# Patient Record
Sex: Female | Born: 1935
Health system: Southern US, Community
[De-identification: ages and names within clinical notes are randomized; demographics above are authoritative.]

## PROBLEM LIST (undated history)

## (undated) DIAGNOSIS — R296 Repeated falls: Secondary | ICD-10-CM

## (undated) DIAGNOSIS — S3282XA Multiple fractures of pelvis without disruption of pelvic ring, initial encounter for closed fracture: Secondary | ICD-10-CM

## (undated) DIAGNOSIS — F329 Major depressive disorder, single episode, unspecified: Secondary | ICD-10-CM

## (undated) DIAGNOSIS — J189 Pneumonia, unspecified organism: Secondary | ICD-10-CM

## (undated) DIAGNOSIS — N390 Urinary tract infection, site not specified: Secondary | ICD-10-CM

## (undated) DIAGNOSIS — IMO0002 Reserved for concepts with insufficient information to code with codable children: Secondary | ICD-10-CM

## (undated) DIAGNOSIS — F0390 Unspecified dementia without behavioral disturbance: Secondary | ICD-10-CM

## (undated) DIAGNOSIS — F32A Depression, unspecified: Secondary | ICD-10-CM

## (undated) HISTORY — PX: ABDOMINAL HYSTERECTOMY: SHX81

## (undated) HISTORY — PX: FRACTURE SURGERY: SHX138

---

## 2004-09-02 ENCOUNTER — Ambulatory Visit: Payer: Self-pay | Admitting: Internal Medicine

## 2005-01-26 ENCOUNTER — Ambulatory Visit: Payer: Self-pay | Admitting: Internal Medicine

## 2005-02-10 ENCOUNTER — Ambulatory Visit: Payer: Self-pay | Admitting: Surgery

## 2005-10-06 ENCOUNTER — Ambulatory Visit: Payer: Self-pay | Admitting: Internal Medicine

## 2006-10-27 ENCOUNTER — Ambulatory Visit: Payer: Self-pay | Admitting: Gastroenterology

## 2006-11-09 ENCOUNTER — Ambulatory Visit: Payer: Self-pay | Admitting: Internal Medicine

## 2007-11-12 ENCOUNTER — Ambulatory Visit: Payer: Self-pay | Admitting: Internal Medicine

## 2008-11-14 ENCOUNTER — Ambulatory Visit: Payer: Self-pay | Admitting: Internal Medicine

## 2010-01-13 ENCOUNTER — Ambulatory Visit: Payer: Self-pay | Admitting: Internal Medicine

## 2011-03-09 ENCOUNTER — Ambulatory Visit: Payer: Self-pay | Admitting: Internal Medicine

## 2011-03-11 ENCOUNTER — Ambulatory Visit: Payer: Self-pay | Admitting: Internal Medicine

## 2014-12-24 DIAGNOSIS — Z0000 Encounter for general adult medical examination without abnormal findings: Secondary | ICD-10-CM | POA: Diagnosis not present

## 2014-12-24 DIAGNOSIS — F0150 Vascular dementia without behavioral disturbance: Secondary | ICD-10-CM | POA: Diagnosis not present

## 2014-12-24 DIAGNOSIS — D568 Other thalassemias: Secondary | ICD-10-CM | POA: Diagnosis not present

## 2014-12-24 DIAGNOSIS — F328 Other depressive episodes: Secondary | ICD-10-CM | POA: Diagnosis not present

## 2015-04-02 ENCOUNTER — Other Ambulatory Visit: Payer: Self-pay | Admitting: Internal Medicine

## 2015-04-02 DIAGNOSIS — F0150 Vascular dementia without behavioral disturbance: Secondary | ICD-10-CM | POA: Diagnosis not present

## 2015-04-02 DIAGNOSIS — F332 Major depressive disorder, recurrent severe without psychotic features: Secondary | ICD-10-CM | POA: Diagnosis not present

## 2015-04-02 DIAGNOSIS — E538 Deficiency of other specified B group vitamins: Secondary | ICD-10-CM | POA: Diagnosis not present

## 2015-04-02 DIAGNOSIS — R05 Cough: Secondary | ICD-10-CM | POA: Diagnosis not present

## 2015-04-02 DIAGNOSIS — F3289 Other specified depressive episodes: Secondary | ICD-10-CM

## 2015-04-02 DIAGNOSIS — Z79899 Other long term (current) drug therapy: Secondary | ICD-10-CM | POA: Diagnosis not present

## 2015-04-02 DIAGNOSIS — R0602 Shortness of breath: Secondary | ICD-10-CM | POA: Diagnosis not present

## 2015-04-02 DIAGNOSIS — M150 Primary generalized (osteo)arthritis: Secondary | ICD-10-CM | POA: Diagnosis not present

## 2015-04-02 DIAGNOSIS — D568 Other thalassemias: Secondary | ICD-10-CM | POA: Diagnosis not present

## 2015-04-02 DIAGNOSIS — F17209 Nicotine dependence, unspecified, with unspecified nicotine-induced disorders: Secondary | ICD-10-CM | POA: Diagnosis not present

## 2015-04-28 ENCOUNTER — Ambulatory Visit: Admission: RE | Admit: 2015-04-28 | Payer: Commercial Managed Care - HMO | Source: Ambulatory Visit

## 2015-06-02 DIAGNOSIS — Z8701 Personal history of pneumonia (recurrent): Secondary | ICD-10-CM | POA: Diagnosis not present

## 2015-06-02 DIAGNOSIS — J189 Pneumonia, unspecified organism: Secondary | ICD-10-CM | POA: Diagnosis not present

## 2015-07-03 DIAGNOSIS — F3289 Other specified depressive episodes: Secondary | ICD-10-CM | POA: Diagnosis not present

## 2015-07-03 DIAGNOSIS — M150 Primary generalized (osteo)arthritis: Secondary | ICD-10-CM | POA: Diagnosis not present

## 2015-07-03 DIAGNOSIS — Z111 Encounter for screening for respiratory tuberculosis: Secondary | ICD-10-CM | POA: Diagnosis not present

## 2015-07-03 DIAGNOSIS — F0150 Vascular dementia without behavioral disturbance: Secondary | ICD-10-CM | POA: Diagnosis not present

## 2015-07-29 DIAGNOSIS — F3289 Other specified depressive episodes: Secondary | ICD-10-CM | POA: Diagnosis not present

## 2015-07-29 DIAGNOSIS — M545 Low back pain: Secondary | ICD-10-CM | POA: Diagnosis not present

## 2015-07-29 DIAGNOSIS — M810 Age-related osteoporosis without current pathological fracture: Secondary | ICD-10-CM | POA: Diagnosis not present

## 2015-07-29 DIAGNOSIS — F0150 Vascular dementia without behavioral disturbance: Secondary | ICD-10-CM | POA: Diagnosis not present

## 2015-07-29 DIAGNOSIS — G8929 Other chronic pain: Secondary | ICD-10-CM | POA: Diagnosis not present

## 2015-07-29 DIAGNOSIS — M47815 Spondylosis without myelopathy or radiculopathy, thoracolumbar region: Secondary | ICD-10-CM | POA: Diagnosis not present

## 2015-07-29 DIAGNOSIS — M150 Primary generalized (osteo)arthritis: Secondary | ICD-10-CM | POA: Diagnosis not present

## 2015-09-10 DIAGNOSIS — F3289 Other specified depressive episodes: Secondary | ICD-10-CM | POA: Diagnosis not present

## 2015-09-10 DIAGNOSIS — F0150 Vascular dementia without behavioral disturbance: Secondary | ICD-10-CM | POA: Diagnosis not present

## 2015-09-10 DIAGNOSIS — M810 Age-related osteoporosis without current pathological fracture: Secondary | ICD-10-CM | POA: Diagnosis not present

## 2015-09-10 DIAGNOSIS — M5442 Lumbago with sciatica, left side: Secondary | ICD-10-CM | POA: Diagnosis not present

## 2015-09-10 DIAGNOSIS — M150 Primary generalized (osteo)arthritis: Secondary | ICD-10-CM | POA: Diagnosis not present

## 2015-09-16 DIAGNOSIS — M545 Low back pain: Secondary | ICD-10-CM | POA: Diagnosis not present

## 2015-09-17 ENCOUNTER — Ambulatory Visit
Admission: RE | Admit: 2015-09-17 | Discharge: 2015-09-17 | Disposition: A | Discharge status: Home / Self Care | Payer: Commercial Managed Care - HMO | Source: Ambulatory Visit | Attending: Unknown Physician Specialty | Admitting: Unknown Physician Specialty

## 2015-09-17 ENCOUNTER — Other Ambulatory Visit: Payer: Self-pay | Admitting: Unknown Physician Specialty

## 2015-09-17 DIAGNOSIS — M545 Low back pain: Secondary | ICD-10-CM

## 2015-09-17 DIAGNOSIS — R1909 Other intra-abdominal and pelvic swelling, mass and lump: Secondary | ICD-10-CM | POA: Insufficient documentation

## 2015-09-17 DIAGNOSIS — I77811 Abdominal aortic ectasia: Secondary | ICD-10-CM | POA: Insufficient documentation

## 2015-09-17 DIAGNOSIS — M4806 Spinal stenosis, lumbar region: Secondary | ICD-10-CM | POA: Diagnosis not present

## 2015-09-17 DIAGNOSIS — S32110A Nondisplaced Zone I fracture of sacrum, initial encounter for closed fracture: Secondary | ICD-10-CM | POA: Diagnosis not present

## 2015-09-17 DIAGNOSIS — S3219XA Other fracture of sacrum, initial encounter for closed fracture: Secondary | ICD-10-CM | POA: Diagnosis not present

## 2015-09-17 DIAGNOSIS — M419 Scoliosis, unspecified: Secondary | ICD-10-CM | POA: Insufficient documentation

## 2015-09-17 DIAGNOSIS — M4316 Spondylolisthesis, lumbar region: Secondary | ICD-10-CM | POA: Insufficient documentation

## 2015-09-22 ENCOUNTER — Ambulatory Visit: Payer: Commercial Managed Care - HMO

## 2015-10-08 DIAGNOSIS — M84350D Stress fracture, pelvis, subsequent encounter for fracture with routine healing: Secondary | ICD-10-CM | POA: Diagnosis not present

## 2015-10-15 DIAGNOSIS — M419 Scoliosis, unspecified: Secondary | ICD-10-CM | POA: Diagnosis not present

## 2015-10-15 DIAGNOSIS — M810 Age-related osteoporosis without current pathological fracture: Secondary | ICD-10-CM | POA: Diagnosis not present

## 2015-10-15 DIAGNOSIS — F329 Major depressive disorder, single episode, unspecified: Secondary | ICD-10-CM | POA: Diagnosis not present

## 2015-10-15 DIAGNOSIS — M47812 Spondylosis without myelopathy or radiculopathy, cervical region: Secondary | ICD-10-CM | POA: Diagnosis not present

## 2015-10-15 DIAGNOSIS — F0390 Unspecified dementia without behavioral disturbance: Secondary | ICD-10-CM | POA: Diagnosis not present

## 2015-10-15 DIAGNOSIS — M4725 Other spondylosis with radiculopathy, thoracolumbar region: Secondary | ICD-10-CM | POA: Diagnosis not present

## 2015-10-20 DIAGNOSIS — W19XXXA Unspecified fall, initial encounter: Secondary | ICD-10-CM | POA: Diagnosis not present

## 2015-10-20 DIAGNOSIS — M419 Scoliosis, unspecified: Secondary | ICD-10-CM | POA: Diagnosis not present

## 2015-10-20 DIAGNOSIS — M810 Age-related osteoporosis without current pathological fracture: Secondary | ICD-10-CM | POA: Diagnosis not present

## 2015-10-20 DIAGNOSIS — M47812 Spondylosis without myelopathy or radiculopathy, cervical region: Secondary | ICD-10-CM | POA: Diagnosis not present

## 2015-10-20 DIAGNOSIS — F0390 Unspecified dementia without behavioral disturbance: Secondary | ICD-10-CM | POA: Diagnosis not present

## 2015-10-20 DIAGNOSIS — F329 Major depressive disorder, single episode, unspecified: Secondary | ICD-10-CM | POA: Diagnosis not present

## 2015-10-20 DIAGNOSIS — M4725 Other spondylosis with radiculopathy, thoracolumbar region: Secondary | ICD-10-CM | POA: Diagnosis not present

## 2015-10-20 DIAGNOSIS — M549 Dorsalgia, unspecified: Secondary | ICD-10-CM | POA: Diagnosis not present

## 2015-10-21 ENCOUNTER — Emergency Department: Payer: Commercial Managed Care - HMO

## 2015-10-21 ENCOUNTER — Observation Stay: Payer: Commercial Managed Care - HMO

## 2015-10-21 ENCOUNTER — Observation Stay
Admission: EM | Admit: 2015-10-21 | Discharge: 2015-10-23 | Disposition: A | Discharge status: Skilled Nursing Facility | Payer: Commercial Managed Care - HMO | Attending: Internal Medicine | Admitting: Internal Medicine

## 2015-10-21 DIAGNOSIS — W19XXXA Unspecified fall, initial encounter: Secondary | ICD-10-CM

## 2015-10-21 DIAGNOSIS — S299XXA Unspecified injury of thorax, initial encounter: Secondary | ICD-10-CM | POA: Diagnosis not present

## 2015-10-21 DIAGNOSIS — R410 Disorientation, unspecified: Secondary | ICD-10-CM | POA: Diagnosis not present

## 2015-10-21 DIAGNOSIS — Z79899 Other long term (current) drug therapy: Secondary | ICD-10-CM | POA: Insufficient documentation

## 2015-10-21 DIAGNOSIS — Z1639 Resistance to other specified antimicrobial drug: Secondary | ICD-10-CM | POA: Diagnosis not present

## 2015-10-21 DIAGNOSIS — R262 Difficulty in walking, not elsewhere classified: Secondary | ICD-10-CM | POA: Insufficient documentation

## 2015-10-21 DIAGNOSIS — F0280 Dementia in other diseases classified elsewhere without behavioral disturbance: Secondary | ICD-10-CM | POA: Insufficient documentation

## 2015-10-21 DIAGNOSIS — Z9181 History of falling: Secondary | ICD-10-CM | POA: Insufficient documentation

## 2015-10-21 DIAGNOSIS — M549 Dorsalgia, unspecified: Secondary | ICD-10-CM | POA: Diagnosis not present

## 2015-10-21 DIAGNOSIS — B958 Unspecified staphylococcus as the cause of diseases classified elsewhere: Secondary | ICD-10-CM | POA: Insufficient documentation

## 2015-10-21 DIAGNOSIS — G309 Alzheimer's disease, unspecified: Secondary | ICD-10-CM | POA: Diagnosis not present

## 2015-10-21 DIAGNOSIS — Z841 Family history of disorders of kidney and ureter: Secondary | ICD-10-CM | POA: Diagnosis not present

## 2015-10-21 DIAGNOSIS — R0781 Pleurodynia: Secondary | ICD-10-CM | POA: Diagnosis not present

## 2015-10-21 DIAGNOSIS — N390 Urinary tract infection, site not specified: Secondary | ICD-10-CM | POA: Diagnosis not present

## 2015-10-21 DIAGNOSIS — S0990XA Unspecified injury of head, initial encounter: Secondary | ICD-10-CM | POA: Diagnosis not present

## 2015-10-21 DIAGNOSIS — Z801 Family history of malignant neoplasm of trachea, bronchus and lung: Secondary | ICD-10-CM | POA: Diagnosis not present

## 2015-10-21 DIAGNOSIS — M546 Pain in thoracic spine: Secondary | ICD-10-CM | POA: Diagnosis not present

## 2015-10-21 DIAGNOSIS — R4182 Altered mental status, unspecified: Secondary | ICD-10-CM | POA: Diagnosis not present

## 2015-10-21 DIAGNOSIS — R030 Elevated blood-pressure reading, without diagnosis of hypertension: Secondary | ICD-10-CM | POA: Diagnosis not present

## 2015-10-21 DIAGNOSIS — G9340 Encephalopathy, unspecified: Principal | ICD-10-CM | POA: Diagnosis present

## 2015-10-21 DIAGNOSIS — S199XXA Unspecified injury of neck, initial encounter: Secondary | ICD-10-CM | POA: Diagnosis not present

## 2015-10-21 DIAGNOSIS — N3000 Acute cystitis without hematuria: Secondary | ICD-10-CM | POA: Diagnosis not present

## 2015-10-21 DIAGNOSIS — R531 Weakness: Secondary | ICD-10-CM | POA: Diagnosis not present

## 2015-10-21 DIAGNOSIS — N3001 Acute cystitis with hematuria: Secondary | ICD-10-CM | POA: Diagnosis not present

## 2015-10-21 HISTORY — DX: Reserved for concepts with insufficient information to code with codable children: IMO0002

## 2015-10-21 HISTORY — DX: Multiple fractures of pelvis without disruption of pelvic ring, initial encounter for closed fracture: S32.82XA

## 2015-10-21 HISTORY — DX: Unspecified dementia, unspecified severity, without behavioral disturbance, psychotic disturbance, mood disturbance, and anxiety: F03.90

## 2015-10-21 LAB — URINALYSIS COMPLETE WITH MICROSCOPIC (ARMC ONLY)
Bilirubin Urine: NEGATIVE
Glucose, UA: NEGATIVE mg/dL
Ketones, ur: NEGATIVE mg/dL
Nitrite: NEGATIVE
PH: 6 (ref 5.0–8.0)
PROTEIN: NEGATIVE mg/dL
SQUAMOUS EPITHELIAL / LPF: NONE SEEN
Specific Gravity, Urine: 1.016 (ref 1.005–1.030)

## 2015-10-21 LAB — MRSA PCR SCREENING: MRSA BY PCR: POSITIVE — AB

## 2015-10-21 LAB — BASIC METABOLIC PANEL
ANION GAP: 7 (ref 5–15)
BUN: 26 mg/dL — ABNORMAL HIGH (ref 6–20)
CHLORIDE: 106 mmol/L (ref 101–111)
CO2: 27 mmol/L (ref 22–32)
Calcium: 9.1 mg/dL (ref 8.9–10.3)
Creatinine, Ser: 0.81 mg/dL (ref 0.44–1.00)
GFR calc Af Amer: >60 mL/min (ref >60)
Glucose, Bld: 106 mg/dL — ABNORMAL HIGH (ref 65–99)
POTASSIUM: 3.9 mmol/L (ref 3.5–5.1)
SODIUM: 140 mmol/L (ref 135–145)

## 2015-10-21 LAB — CBC
HEMATOCRIT: 37 % (ref 35.0–47.0)
HEMOGLOBIN: 11.7 g/dL — AB (ref 12.0–16.0)
MCH: 19.3 pg — ABNORMAL LOW (ref 26.0–34.0)
MCHC: 31.7 g/dL — ABNORMAL LOW (ref 32.0–36.0)
MCV: 60.9 fL — AB (ref 80.0–100.0)
Platelets: 338 10*3/uL (ref 150–440)
RBC: 6.06 MIL/uL — ABNORMAL HIGH (ref 3.80–5.20)
RDW: 17.4 % — ABNORMAL HIGH (ref 11.5–14.5)
WBC: 10 10*3/uL (ref 3.6–11.0)

## 2015-10-21 MED ORDER — QUETIAPINE FUMARATE 25 MG PO TABS
25.0000 mg | ORAL_TABLET | Freq: Every day | ORAL | Status: DC
  Administered 2015-10-21 – 2015-10-22 (×2): 25 mg via ORAL
  Filled 2015-10-21 (×2): qty 1

## 2015-10-21 MED ORDER — LORAZEPAM 1 MG PO TABS: 1.0000 mg | ORAL_TABLET | Freq: Two times a day (BID) | ORAL | Status: DC | PRN

## 2015-10-21 MED ORDER — ACETAMINOPHEN 325 MG PO TABS
650.0000 mg | ORAL_TABLET | Freq: Four times a day (QID) | ORAL | Status: DC | PRN
  Administered 2015-10-22 – 2015-10-23 (×2): 650 mg via ORAL
  Filled 2015-10-21 (×2): qty 2

## 2015-10-21 MED ORDER — SERTRALINE HCL 100 MG PO TABS
100.0000 mg | ORAL_TABLET | Freq: Every day | ORAL | Status: DC
  Administered 2015-10-22 – 2015-10-23 (×2): 100 mg via ORAL
  Filled 2015-10-21 (×2): qty 1

## 2015-10-21 MED ORDER — ACETAMINOPHEN 650 MG RE SUPP: 650.0000 mg | Freq: Four times a day (QID) | RECTAL | Status: DC | PRN

## 2015-10-21 MED ORDER — LORAZEPAM 2 MG/ML IJ SOLN
1.0000 mg | Freq: Once | INTRAMUSCULAR | Status: AC
  Administered 2015-10-21: 1 mg via INTRAVENOUS
  Filled 2015-10-21: qty 1

## 2015-10-21 MED ORDER — DEXTROSE 5 % IV SOLN
1.0000 g | Freq: Once | INTRAVENOUS | Status: AC
  Administered 2015-10-21: 1 g via INTRAVENOUS
  Filled 2015-10-21: qty 10

## 2015-10-21 MED ORDER — ENOXAPARIN SODIUM 40 MG/0.4ML ~~LOC~~ SOLN
40.0000 mg | SUBCUTANEOUS | Status: DC
  Administered 2015-10-22 – 2015-10-23 (×2): 40 mg via SUBCUTANEOUS
  Filled 2015-10-21 (×2): qty 0.4

## 2015-10-21 MED ORDER — LORAZEPAM 0.5 MG PO TABS
0.5000 mg | ORAL_TABLET | Freq: Two times a day (BID) | ORAL | Status: DC
  Administered 2015-10-21 – 2015-10-23 (×4): 0.5 mg via ORAL
  Filled 2015-10-21 (×4): qty 1

## 2015-10-21 MED ORDER — DONEPEZIL HCL 5 MG PO TABS
10.0000 mg | ORAL_TABLET | Freq: Every day | ORAL | Status: DC
  Administered 2015-10-21 – 2015-10-22 (×2): 10 mg via ORAL
  Filled 2015-10-21 (×2): qty 2

## 2015-10-21 MED ORDER — DEXTROSE 5 % IV SOLN: 1.0000 g | INTRAVENOUS | Status: DC

## 2015-10-21 MED ORDER — DICLOFENAC SODIUM 1 % TD GEL
2.0000 g | Freq: Two times a day (BID) | TRANSDERMAL | Status: DC
  Administered 2015-10-21 – 2015-10-23 (×3): 2 g via TOPICAL
  Filled 2015-10-21: qty 100

## 2015-10-21 MED ORDER — DEXTROSE 5 % IV SOLN
1.0000 g | INTRAVENOUS | Status: DC
  Administered 2015-10-22 – 2015-10-23 (×2): 1 g via INTRAVENOUS
  Filled 2015-10-21 (×2): qty 10

## 2015-10-21 MED ORDER — MEMANTINE HCL 5 MG PO TABS
10.0000 mg | ORAL_TABLET | Freq: Two times a day (BID) | ORAL | Status: DC
  Administered 2015-10-21 – 2015-10-23 (×4): 10 mg via ORAL
  Filled 2015-10-21 (×4): qty 2

## 2015-10-21 MED ORDER — HYDROCODONE-ACETAMINOPHEN 5-325 MG PO TABS
0.5000 | ORAL_TABLET | Freq: Four times a day (QID) | ORAL | Status: DC | PRN
  Administered 2015-10-21 – 2015-10-23 (×4): 0.5 via ORAL
  Filled 2015-10-21 (×4): qty 1

## 2015-10-21 NOTE — ED Notes (Signed)
Called report to [phone] - spoke with Trula OreChristina to give report - she questioned why they are admitting this pt to oncology and i told her IDK -  She called bed tracking and they are going to reassign the bed ?  I will await more info

## 2015-10-21 NOTE — ED Notes (Signed)
Called son Janece CanterburyJames Pfeifer at [phone], left voicemail. Megan, MRI tech, needs to talk to son to ask MRI questions, this RN explained that in voicemail. Gave return number and name in voicemail. Informed Megan about call to son.

## 2015-10-21 NOTE — ED Provider Notes (Signed)
[facility] Emergency Department Provider Note    ____________________________________________  Time seen: ~0220  I have reviewed the triage vital signs and the nursing notes.   HISTORY  Chief Complaint Fall   History limited by: confusion   HPI Michelle Ballard is a 82 y.o. female who presents to the emergency department today from living facility after an unwitnessed fall. Patient's main complaint to me was for back pain. She states it was in between her shoulder blades. She states it is severe. She does say that she has a history of chronic back pain although never in that area. She denies pain in any other part of her body. She does say that she feels somewhat confused. She is not sure why she is confused. She denies any recent chest pain, abdominal pain, fevers. Denies any dysuria.   History reviewed. No pertinent past medical history.  There are no active problems to display for this patient.   History reviewed. No pertinent past surgical history.  No current outpatient prescriptions on file.  Allergies Review of patient's allergies indicates no known allergies.  History reviewed. No pertinent family history.  Social History Social History  Substance Use Topics  . Smoking status: Unknown If Ever Smoked  . Smokeless tobacco: None  . Alcohol Use: No    Review of Systems  Constitutional: Negative for fever. Cardiovascular: Negative for chest pain. Respiratory: Negative for shortness of breath. Gastrointestinal: Negative for abdominal pain, vomiting and diarrhea. Genitourinary: Negative for dysuria. Musculoskeletal: Positive for upper back pain Skin: Negative for rash. Neurological: Negative for headaches, focal weakness or numbness.  10-point ROS otherwise negative.  ____________________________________________   PHYSICAL EXAM:  VITAL SIGNS: ED Triage Vitals  Enc Vitals Group     BP 10/21/15 0051 187/91 mmHg     Pulse Rate  10/21/15 0051 78     Resp 10/21/15 0051 22     Temp 10/21/15 0051 98.1 F (36.7 C)     Temp src --      SpO2 10/21/15 0051 98 %     Weight 10/21/15 0051 142 lb (64.411 kg)     Height --      Head Cir --      Peak Flow --      Pain Score 10/21/15 0052 10   Constitutional: Alert and oriented. Well appearing and in no distress. Eyes: Conjunctivae are normal. PERRL. Normal extraocular movements. ENT   Head: Normocephalic and atraumatic.   Nose: No congestion/rhinnorhea.   Mouth/Throat: Mucous membranes are moist.   Neck: No stridor.Tender to palpation in the lower cervical spine. Hematological/Lymphatic/Immunilogical: No cervical lymphadenopathy. Cardiovascular: Normal rate, regular rhythm.  No murmurs, rubs, or gallops. Respiratory: Normal respiratory effort without tachypnea nor retractions. Breath sounds are clear and equal bilaterally. No wheezes/rales/rhonchi. Gastrointestinal: Soft and nontender. No distention.  Genitourinary: Deferred Musculoskeletal: Normal range of motion in all extremities. No joint effusions.  No lower extremity tenderness nor edema. Tender to palpation of the thoracic spine. Neurologic:  Normal speech and language. No gross focal neurologic deficits are appreciated.  Skin:  Skin is warm, dry and intact. No rash noted. Psychiatric: Mood and affect are normal. Speech and behavior are normal. Patient exhibits appropriate insight and judgment.  ____________________________________________    LABS (pertinent positives/negatives)  Labs Reviewed  URINALYSIS COMPLETEWITH MICROSCOPIC ([facility] ONLY) - Abnormal; Notable for the following:    Color, Urine YELLOW (*)    APPearance CLEAR (*)    Hgb urine dipstick 1+ (*)  Leukocytes, UA 1+ (*)    Bacteria, UA RARE (*)    All other components within normal limits  BASIC METABOLIC PANEL - Abnormal; Notable for the following:    Glucose, Bld 106 (*)    BUN 26 (*)    All other components within normal  limits  CBC - Abnormal; Notable for the following:    RBC 6.06 (*)    Hemoglobin 11.7 (*)    MCV 60.9 (*)    MCH 19.3 (*)    MCHC 31.7 (*)    RDW 17.4 (*)    All other components within normal limits  URINE CULTURE     ____________________________________________   EKG  None  ____________________________________________    RADIOLOGY  Thoracic spine IMPRESSION: Degenerative changes, demineralization, and scoliosis of the thoracic spine. No acute displaced fractures identified.   CT head/cervical spine IMPRESSION: No acute intracranial abnormalities. Chronic atrophy and small vessel ischemic changes.  Nonspecific reversal of the usual cervical lordosis with slight anterior subluxation at C2-3. Changes likely positional or degenerative but ligamentous injury is not excluded. Diffuse degenerative change throughout the cervical spine. No acute displaced fractures are identified.  ____________________________________________   PROCEDURES  Procedure(s) performed: None  Critical Care performed: No  ____________________________________________   INITIAL IMPRESSION / ASSESSMENT AND PLAN / ED COURSE  Pertinent labs & imaging results that were available during my care of the patient were reviewed by me and considered in my medical decision making (see chart for details).  Patient presented to the emergency department today after a fall with complaints of back pain. On exam patient had some minimal tenderness at base of her cervical spine and thoracic spine. CT scan didn't show possible findings consistent with ligamentous injury of the cervical spine. Because of this an MRI was ordered and the patient was placed in a c-collar. In addition the patient's urine was consistent with a urinary tract infection. She was given a dose of Rocephin.  ____________________________________________   FINAL CLINICAL IMPRESSION(S) / ED DIAGNOSES  UTI Back pain  Note: This  dictation was prepared with Dragon dictation. Any transcriptional errors that result from this process are unintentional    Phineas Semen, MD 10/21/15 [phone]

## 2015-10-21 NOTE — ED Notes (Signed)
This RN entered room to find that the pt had taken her C-collar off. C-collar was placed back on pt.   A new IV was inserted and wrapped in gauze with an arm board.

## 2015-10-21 NOTE — ED Notes (Signed)
Pt lying on stretcher, 2 pillows behind back and head, 2 warm blankets on pt. Bed alarm attacked to pt. Yellow fall bracelet placed on wrist.

## 2015-10-21 NOTE — ED Notes (Signed)
Update provided to her son and his spouse  Pt awake and listening to our conversation   Plan is to admit pt to the hospital   - +UTI  +fall history

## 2015-10-21 NOTE — Clinical Social Work Note (Signed)
Clinical Social Work Assessment  Patient Details  Name: Michelle Ballard MRN: [id number] Date of Birth: March 31, 1931  Date of referral:  10/21/15               Reason for consult:  Facility Placement                Permission sought to share information with:  Facility Medical sales representativeContact Representative Permission granted to share information::     Name::      Janece CanterburyJames Wesch (son) [phone]  Agency::     Relationship::     Contact Information:     Housing/Transportation Living arrangements for the past 2 months:  Assisted Living Facility Source of Information:  Facility Patient Interpreter Needed:  None Criminal Activity/Legal Involvement Pertinent to Current Situation/Hospitalization:  No - Comment as needed Significant Relationships:   Adult Children, Family Supports  Lives with:  Facility Resident Do you feel safe going back to the place where you live?  Yes Need for family participation in patient care:  Yes (Comment)  Care giving concerns: No care giving concerns addressed at this time.   Social Worker assessment / plan:  CSW completed assessment via telephone call with [facility] worker [phone]. CSW introduced herself and her role as a Child psychotherapistsocial worker. Worker informed CSW that pt receives STR at [facility] and was scheduled to leave in a couple weeks before the fall occurred. Worker states that at baseline pt is generally confused and will ask questions repeatedly such as "Where am I? Why am I here?". Worker states that pt is pushed in a wheelchair at the facility, needs help bathing, but is able to feed herself independently. Worker states that pt has 3 sons and 2 of them visit her regularly. Worker describes the sons as supportive and involved in pt's care. Facility is willing to accept pt back upon d/c. CSW will keep [facility] updated about the condition of the pt and will send FL2 to facility should pt become admitted.  Employment status:  Retired Health and safety inspectornsurance information:   Medicare PT Recommendations:  Not assessed at this time Information / Referral to community resources:  Skilled Nursing Facility  Patient/Family's Response to care: Pt will return to ALF upon d/c.  Patient/Family's Understanding of and Emotional Response to Diagnosis, Current Treatment, and Prognosis:  Pt is appreciative of the care she has received at this time.  Emotional Assessment Appearance:    Attitude/Demeanor/Rapport:  Unable to Assess Affect (typically observed):  Unable to Assess Orientation:  Fluctuating Orientation (Suspected and/or reported Sundowners), Oriented to Self Alcohol / Substance use:  Not Applicable Psych involvement (Current and /or in the community):  No (Comment)  Discharge Needs  Concerns to be addressed:  No discharge needs identified Readmission within the last 30 days:  No Current discharge risk:  None Barriers to Discharge:  No Barriers Identified   Jonathon JordanLynn B Khloee Garza, LCSWA 10/21/2015, 10:50 AM

## 2015-10-21 NOTE — ED Notes (Signed)
Pt saturated brief and bedding. Pt was cleaned and changed, new bedding and brief and chuck placed.

## 2015-10-21 NOTE — H&P (Signed)
[facility] [place] [facility]   PATIENT NAME: Michelle Ballard    MR#:  [id number]  DATE OF BIRTH:  [birth year]  DATE OF ADMISSION:  10/21/2015  PRIMARY CARE PHYSICIAN: Barbette Reichmann, MD   REQUESTING/REFERRING PHYSICIAN: Dr Raymon Mutton  CHIEF COMPLAINT:   Chief Complaint  Patient presents with  . Fall    HISTORY OF PRESENT ILLNESS:  Michelle Ballard  is a 82 y.o. female with a known history of Dementia. She had a fall last night. She is complaining of back pain and rib pain. The patient cannot tell me what happened with the fall secondary to dementia. Family thinks she is more confused than normal. She answers questions appropriately. In the ER she was found to have a urinary tract infection. In the ER she had multiple testing on her neck and ER physician spoke with neurosurgeon about the films and cleared her to take off the neck collar. Patient not complaining of any neck pain.  PAST MEDICAL HISTORY:   Past Medical History  Diagnosis Date  . Dementia   . Multiple pelvic fractures (HCC)     PAST SURGICAL HISTORY:   Past Surgical History  Procedure Laterality Date  . No past surgeries      SOCIAL HISTORY:   Social History  Substance Use Topics  . Smoking status: Former Games developer  . Smokeless tobacco: Not on file  . Alcohol Use: No    FAMILY HISTORY:   Family History  Problem Relation Age of Onset  . Lung cancer Mother   . Lung cancer Father   . Cirrhosis Brother     DRUG ALLERGIES:  No Known Allergies  REVIEW OF SYSTEMS:  CONSTITUTIONAL: No fever, fatigue or weakness.  EYES: No blurred or double vision. Wears glasses EARS, NOSE, AND THROAT: No tinnitus or ear pain. No sore throat RESPIRATORY: Occasional cough with dark phlegm, occasional shortness of breath, no wheezing or hemoptysis.  CARDIOVASCULAR: No chest pain, orthopnea, edema.  GASTROINTESTINAL: No nausea, vomiting, or abdominal pain. No blood in bowel movements. Positive  for diarrhea occasionally GENITOURINARY: No dysuria, hematuria.  ENDOCRINE: No polyuria, nocturia,  HEMATOLOGY: No anemia, easy bruising or bleeding SKIN: No rash or lesion. MUSCULOSKELETAL: Low back pain. Mid back pain and right rib pain  NEUROLOGIC: No tingling, numbness, weakness.  PSYCHIATRY: History of dementia  MEDICATIONS AT HOME:   Prior to Admission medications   Medication Sig Start Date End Date Taking? Authorizing Provider  diclofenac sodium (VOLTAREN) 1 % GEL Apply 2 g topically 2 (two) times daily.   Yes Historical Provider, MD  donepezil (ARICEPT) 10 MG tablet Take 10 mg by mouth at bedtime.   Yes Historical Provider, MD  HYDROcodone-acetaminophen (NORCO/VICODIN) 5-325 MG tablet Take 0.5 tablets by mouth every 6 (six) hours as needed for moderate pain.   Yes Historical Provider, MD  LORazepam (ATIVAN) 0.5 MG tablet Take 0.5 mg by mouth 2 (two) times daily.   Yes Historical Provider, MD  LORazepam (ATIVAN) 1 MG tablet Take 1 mg by mouth 2 (two) times daily as needed for anxiety.   Yes Historical Provider, MD  memantine (NAMENDA) 10 MG tablet Take 10 mg by mouth 2 (two) times daily.   Yes Historical Provider, MD  QUEtiapine (SEROQUEL) 25 MG tablet Take 25 mg by mouth at bedtime.   Yes Historical Provider, MD  sertraline (ZOLOFT) 100 MG tablet Take 100 mg by mouth daily.   Yes Historical Provider, MD      VITAL SIGNS:  Blood  pressure 181/81, pulse 81, temperature 98.1 F (36.7 C), resp. rate 17, weight 64.411 kg (142 lb), SpO2 96 %.  PHYSICAL EXAMINATION:  GENERAL:  82 y.o.-year-old patient lying in the bed with no acute distress.  EYES: Pupils equal, round, reactive to light and accommodation. No scleral icterus. Extraocular muscles intact.  HEENT: Head atraumatic, normocephalic. Oropharynx and nasopharynx clear.  NECK:  Supple, no jugular venous distention. No thyroid enlargement, no tenderness.  LUNGS: Normal breath sounds bilaterally, no wheezing, rales,rhonchi or  crepitation. No use of accessory muscles of respiration.  CARDIOVASCULAR: S1, S2 normal. No murmurs, rubs, or gallops.  ABDOMEN: Soft, nontender, nondistended. Bowel sounds present. No organomegaly or mass.  EXTREMITIES: Trace pedal edema. No cyanosis, or clubbing. Pain to palpation over thoracic spine and right lateral ribs NEUROLOGIC: Cranial nerves II through XII are intact. Muscle strength 5/5 in all extremities. Patient able to straight leg raise bilaterally. Sensation intact. Gait not checked.  PSYCHIATRIC: The patient is alert and answers yes or no questions appropriately.  SKIN: No rash, lesion, or ulcer.   LABORATORY PANEL:   CBC  Recent Labs Lab 10/21/15 0058  WBC 10.0  HGB 11.7*  HCT 37.0  PLT 338   ------------------------------------------------------------------------------------------------------------------  Chemistries   Recent Labs Lab 10/21/15 0058  NA 140  K 3.9  CL 106  CO2 27  GLUCOSE 106*  BUN 26*  CREATININE 0.81  CALCIUM 9.1   ------------------------------------------------------------------------------------------------------------------   RADIOLOGY:  Dg Thoracic Spine 2 View  10/21/2015  CLINICAL DATA:  Unwitnessed fall.  Pain in the mid back.  Confusion. EXAM: THORACIC SPINE 2 VIEWS COMPARISON:  None. FINDINGS: Lower thoracic and lumbar scoliosis, centered at about T12 and convex towards the left. Diffuse bone demineralization. Diffuse degenerative changes throughout the thoracic spine. No anterior subluxation. No vertebral compression deformities. No paraspinal soft tissue swelling. Calcification of the aorta. IMPRESSION: Degenerative changes, demineralization, and scoliosis of the thoracic spine. No acute displaced fractures identified. Electronically Signed   By: Burman Nieves M.D.   On: 10/21/2015 02:55   Ct Head Wo Contrast  10/21/2015  CLINICAL DATA:  Unwitnessed fall.  Confusion. EXAM: CT HEAD WITHOUT CONTRAST CT CERVICAL SPINE  WITHOUT CONTRAST TECHNIQUE: Multidetector CT imaging of the head and cervical spine was performed following the standard protocol without intravenous contrast. Multiplanar CT image reconstructions of the cervical spine were also generated. COMPARISON:  None. FINDINGS: CT HEAD FINDINGS Diffuse cerebral atrophy. Ventricular dilatation consistent with central atrophy. Low-attenuation changes in the deep white matter consistent with small vessel ischemia. No mass effect or midline shift. No abnormal extra-axial fluid collections. Gray-white matter junctions are distinct. Basal cisterns are not effaced. No evidence of acute intracranial hemorrhage. No depressed skull fractures. Visualized paranasal sinuses and mastoid air cells are not opacified. Vascular calcifications. CT CERVICAL SPINE FINDINGS There is mild reversal of the usual cervical lordosis with slight anterior subluxation of C2 on C3. Changes may be positional or degenerative but ligamentous injury is not excluded. Correlation physical examination recommended. Diffuse degenerative changes throughout the cervical spine. Normal alignment of the cervical facet joints with diffuse degenerative changes present. Uncovertebral spurring causes encroachment upon the neural foramina at multiple levels bilaterally. No vertebral compression deformities. No prevertebral soft tissue swelling. No focal bone lesion or bone destruction. C1-2 articulation appears intact. Scarring in the lung apices. Vascular calcifications. IMPRESSION: No acute intracranial abnormalities. Chronic atrophy and small vessel ischemic changes. Nonspecific reversal of the usual cervical lordosis with slight anterior subluxation at C2-3. Changes likely positional or  degenerative but ligamentous injury is not excluded. Diffuse degenerative change throughout the cervical spine. No acute displaced fractures are identified. Electronically Signed   By: Burman NievesWilliam  Stevens M.D.   On: 10/21/2015 03:13   Ct  Cervical Spine Wo Contrast  10/21/2015  CLINICAL DATA:  Unwitnessed fall.  Confusion. EXAM: CT HEAD WITHOUT CONTRAST CT CERVICAL SPINE WITHOUT CONTRAST TECHNIQUE: Multidetector CT imaging of the head and cervical spine was performed following the standard protocol without intravenous contrast. Multiplanar CT image reconstructions of the cervical spine were also generated. COMPARISON:  None. FINDINGS: CT HEAD FINDINGS Diffuse cerebral atrophy. Ventricular dilatation consistent with central atrophy. Low-attenuation changes in the deep white matter consistent with small vessel ischemia. No mass effect or midline shift. No abnormal extra-axial fluid collections. Gray-white matter junctions are distinct. Basal cisterns are not effaced. No evidence of acute intracranial hemorrhage. No depressed skull fractures. Visualized paranasal sinuses and mastoid air cells are not opacified. Vascular calcifications. CT CERVICAL SPINE FINDINGS There is mild reversal of the usual cervical lordosis with slight anterior subluxation of C2 on C3. Changes may be positional or degenerative but ligamentous injury is not excluded. Correlation physical examination recommended. Diffuse degenerative changes throughout the cervical spine. Normal alignment of the cervical facet joints with diffuse degenerative changes present. Uncovertebral spurring causes encroachment upon the neural foramina at multiple levels bilaterally. No vertebral compression deformities. No prevertebral soft tissue swelling. No focal bone lesion or bone destruction. C1-2 articulation appears intact. Scarring in the lung apices. Vascular calcifications. IMPRESSION: No acute intracranial abnormalities. Chronic atrophy and small vessel ischemic changes. Nonspecific reversal of the usual cervical lordosis with slight anterior subluxation at C2-3. Changes likely positional or degenerative but ligamentous injury is not excluded. Diffuse degenerative change throughout the  cervical spine. No acute displaced fractures are identified. Electronically Signed   By: Burman NievesWilliam  Stevens M.D.   On: 10/21/2015 03:13   Mr Cervical Spine Wo Contrast  10/21/2015  CLINICAL DATA:  Unwitnessed fall. Upper back pain. CT revealed some anterior subluxation at C2-3. EXAM: MRI CERVICAL SPINE WITHOUT CONTRAST TECHNIQUE: Multiplanar, multisequence MR imaging of the cervical spine was performed. No intravenous contrast was administered. COMPARISON:  10/21/2015 CT scan FINDINGS: Despite efforts by the technologist and patient, severe motion artifact is present on today's exam and could not be eliminated. This reduces exam sensitivity and specificity. Alignment: 2 mm of grade 1 anterolisthesis at C2-3. 2 mm anterolisthesis at C7-T1. Vertebrae: No significant vertebral marrow edema is identified. There is disc desiccation throughout the cervical spine with loss of disc height most notable at the C5-6 and C6-7 levels. Cord: Grossly unremarkable Posterior Fossa, vertebral arteries, paraspinal tissues: At the T1-2 level within and extending from the neural foramen the is a 1.9 by 1.3 by 1.0 cm structure with high T2 and low T1 signal characteristics similar to fluid. On the CT scan this had low density characteristics. Top differential diagnostic considerations include perineural cyst, neurofibroma/schwannoma, or less likely a posttraumatic pseudomeningocele. Disc levels: C2-3: No impingement. Left greater than right facet arthropathy likely accounting for the subluxation at this level. No definite periligamentous edema to favor a ligamentous injury at this level. C3-4: Prominent bilateral foraminal stenosis and moderate central narrowing of the thecal sac due to disc bulge, uncinate spurring, and facet arthropathy. C4-5: Mild to moderate bilateral foraminal stenosis and mild central narrowing of the thecal sac due to uncinate spurring, disc bulge, and facet arthropathy. C5-6: Moderate to prominent left and  moderate right foraminal stenosis and mild central narrowing  of the thecal sac due to disc bulge, uncinate spurring, and facet arthropathy. C6-7: Prominent bilateral foraminal stenosis and mild to moderate central narrowing of the thecal sac due to disc bulge, uncinate spurring, and facet arthropathy. C7-T1: No impingement. Disc uncovering and facet arthropathy. No periligamentous edema. T1-2: Fluid signal intensity lesion in the left neural foramen as noted above. IMPRESSION: 1. Despite efforts by the technologist and patient, motion artifact is present on today's exam and could not be eliminated. This reduces exam sensitivity and specificity. 2. No periligamentous edema at C2-3 or C7-T1 to suggest ligamentous disruption. 3. 1.9 by 1.3 by 1.0 cm fluid signal intensity structure extending within and out from the left neural foramen at the T1-2 vertebral level. Differential diagnostic considerations include perineural cyst, neurofibroma/schwannoma, or less likely a posttraumatic pseudomeningocele. There is far too much motion artifact on today' s exam to assess the nerve roots in this vicinity for avulsion/injury, correlate with any specific findings to suggest brachial plexus injury on the left. In the nonacute setting, differentiation of perineural cyst from neurofibroma/schwannoma could be performed using post-contrast imaging to assess for enhancement. No definite widening of the neural foramen. 4. Cervical spondylosis and degenerative disc disease cause prominent impingement at C3-4 and C6-7 ; moderate to prominent impingement at C5-6; and mild to moderate impingement at C4-5. Electronically Signed   By: Gaylyn Rong M.D.   On: 10/21/2015 09:31    IMPRESSION AND PLAN:   1. Acute encephalopathy with history of dementia. Patient seems to be answering questions appropriately to me. Family concerned that she is not acting how she normally does and not recognizing them. This can get worse here in the  hospital. Continue the patient's current psychiatric medications. 2. Acute cystitis with hematuria. Rocephin given in the emergency room about continue that on a daily basis. Follow-up urine culture. 3. Back pain and right rib pain will x-ray the thoracic spine and right ribs to rule out fracture. Looks like patient takes Vicodin at home. 4. Elevated blood pressure without diagnosis of hypertension. This is likely secondary to pain. Control pain first before given a blood pressure medications. 5. History of pelvic fractures. Was cleared by orthopedic to start weightbearing. I will get a physical therapy consultation. 6. Social worker consultation because the patient is from [facility].   All the records are reviewed and case discussed with ED provider. Management plans discussed with the patient, family and they are in agreement.  CODE STATUS: Full code at this point but son will speak with his brother about this.  TOTAL TIME TAKING CARE OF THIS PATIENT: 50 minutes.    Alford Highland M.D on 10/21/2015 at 12:01 PM  Between 7am to 6pm - Pager - [phone]  After 6pm call admission pager [phone]  [facility]  [phone]  CC: Primary care physician; Barbette Reichmann, MD

## 2015-10-21 NOTE — ED Provider Notes (Signed)
-----------------------------------------   8:31 AM on 10/21/2015 -----------------------------------------  Care was assumed from Dr. Derrill KayGoodman at 7 AM pending results of MRI of the cervical spine. Briefly this is an 82 year old female resents with some confusion, fall, found to have urinary tract infection here. MRI of the cervical spine is pending given CT read that was equivocal for possible ligamentous injury. C-collar is in place. The patient will require admission for urinary tract infection and altered mental status. She received ceftriaxone.  ----------------------------------------- 11:07 AM on 10/21/2015 ----------------------------------------- I discussed the MRI of the cervical spine with Dr. Barbaraann BarthelKyle Cabell of [facility]. He has reviewed the imaging, reports the patient has good alignment, chronic kyphosis, no prevertebral swelling making any ligamentous injury or acute trauma unlikely. He recommends removal of cervical collar. Case discussed with hospitalist, Dr. Hilton SinclairWeiting, for admission at this time.   Gayla DossEryka A Salsabeel Gorelick, MD 10/21/15 [phone]

## 2015-10-21 NOTE — ED Notes (Signed)
Pt took IV out on her own. IV found on floor by Thahn, EDT.

## 2015-10-21 NOTE — ED Notes (Signed)
Pt attempting to get out of bed. Pt had torn off bed alarm and BP cuff and 02 saturation bandage. Pt was repositioned in bed, placed sitting up.

## 2015-10-21 NOTE — ED Notes (Addendum)
Pt states between shoulders "hurts so bad". Pt is screaming out "lord please take me." Pt crying. States "I don't want to leave my children." Pt informed that she is not dying, she fell. Pt sitting up in bed then laying down repeatedly.   Pt does not remember falling, pt alert to name but states she does not know birthday.   Pt asked to straighten arm for blood draw/IV and states she cannot.

## 2015-10-21 NOTE — [place] FL2 (Signed)
  [place] MEDICAID FL2 LEVEL OF CARE SCREENING TOOL     IDENTIFICATION  Patient Name: Michelle Ballard Birthdate: 17-Oct-1930 Sex: female Admission Date (Current Location): 10/21/2015  [place]   Facility and Address:  [facility], [address], [place], [place] [address]      Provider Number: [id number]  Attending Physician Name and Address:  No att. providers found  Relative Name and Phone Number:       Current Level of Care: Hospital Recommended Level of Care: Skilled Nursing Facility Prior Approval Number:    Date Approved/Denied:   PASRR Number: [id number]  Discharge Plan: SNF    Current Diagnoses: There are no active problems to display for this patient.   Orientation RESPIRATION BLADDER Height & Weight     Self  Normal Incontinent Weight: 142 lb (64.411 kg) Height:     BEHAVIORAL SYMPTOMS/MOOD NEUROLOGICAL BOWEL NUTRITION STATUS   (None)  (None) Incontinent Diet (Heart healthy)  AMBULATORY STATUS COMMUNICATION OF NEEDS Skin   Extensive Assist Verbally Normal                       Personal Care Assistance Level of Assistance  Bathing, Feeding, Dressing Bathing Assistance: Limited assistance Feeding assistance: Independent Dressing Assistance: Limited assistance     Functional Limitations Info  Sight, Hearing, Speech Sight Info: Adequate Hearing Info: Adequate Speech Info: Adequate    SPECIAL CARE FACTORS FREQUENCY  PT (By licensed PT)     PT Frequency: 5              Contractures      Additional Factors Info  Code Status, Allergies Code Status Info: Not on file Allergies Info: No known allergies            Current Medications (10/21/2015):  This is the current hospital active medication list No current facility-administered medications for this encounter.   Current Outpatient Prescriptions  Medication Sig Dispense Refill  . diclofenac sodium (VOLTAREN) 1 % GEL Apply 2  g topically 2 (two) times daily.    Marland Kitchen. donepezil (ARICEPT) 10 MG tablet Take 10 mg by mouth at bedtime.    Marland Kitchen. HYDROcodone-acetaminophen (NORCO/VICODIN) 5-325 MG tablet Take 0.5 tablets by mouth every 6 (six) hours as needed for moderate pain.    Marland Kitchen. LORazepam (ATIVAN) 0.5 MG tablet Take 0.5 mg by mouth 2 (two) times daily.    Marland Kitchen. LORazepam (ATIVAN) 1 MG tablet Take 1 mg by mouth 2 (two) times daily as needed for anxiety.    . memantine (NAMENDA) 10 MG tablet Take 10 mg by mouth 2 (two) times daily.    . QUEtiapine (SEROQUEL) 25 MG tablet Take 25 mg by mouth at bedtime.    . sertraline (ZOLOFT) 100 MG tablet Take 100 mg by mouth daily.       Discharge Medications: Please see discharge summary for a list of discharge medications.  Relevant Imaging Results:  Relevant Lab Results:   Additional Information SSN [id number]  Jonathon JordanLynn B Velma Hanna, LCSWA

## 2015-10-21 NOTE — ED Notes (Signed)
She is currently in MRI

## 2015-10-21 NOTE — ED Notes (Signed)
Pt presents to ED via [facility] from [facility] for an unwitnessed fall. Pt was found lying on floor next to her bed. Pt does not remember falling, does not know what part of her body she hit. Pt is screaming "it hurts so bad"

## 2015-10-21 NOTE — ED Notes (Signed)
Pt still laying on back on stretcher with c-collar on. Warm blanket covering pt. Side rails up. Bed alarm attached.

## 2015-10-21 NOTE — ED Notes (Signed)
Bed alarm heard while this RN was in another pt room. Upon entering this pt's room, pt was at the end of the bed sitting. Pt was told to lay back down in bed. Pt was moved back up in the bed and placed back on the monitor because all the leads had been pulled off. Lights dimmed to decrease stimuli.

## 2015-10-21 NOTE — ED Notes (Signed)
Pt placed in small hard c-collar.

## 2015-10-22 DIAGNOSIS — R0781 Pleurodynia: Secondary | ICD-10-CM | POA: Diagnosis not present

## 2015-10-22 DIAGNOSIS — G9340 Encephalopathy, unspecified: Secondary | ICD-10-CM | POA: Diagnosis not present

## 2015-10-22 DIAGNOSIS — M549 Dorsalgia, unspecified: Secondary | ICD-10-CM | POA: Diagnosis not present

## 2015-10-22 DIAGNOSIS — N3000 Acute cystitis without hematuria: Secondary | ICD-10-CM | POA: Diagnosis not present

## 2015-10-22 LAB — CBC
HEMATOCRIT: 36.7 % (ref 35.0–47.0)
HEMOGLOBIN: 11.5 g/dL — AB (ref 12.0–16.0)
MCH: 19.2 pg — AB (ref 26.0–34.0)
MCHC: 31.4 g/dL — ABNORMAL LOW (ref 32.0–36.0)
MCV: 61.2 fL — AB (ref 80.0–100.0)
Platelets: 305 10*3/uL (ref 150–440)
RBC: 6.01 MIL/uL — AB (ref 3.80–5.20)
RDW: 17.3 % — ABNORMAL HIGH (ref 11.5–14.5)
WBC: 8.7 10*3/uL (ref 3.6–11.0)

## 2015-10-22 LAB — BASIC METABOLIC PANEL
ANION GAP: 6 (ref 5–15)
BUN: 18 mg/dL (ref 6–20)
CHLORIDE: 109 mmol/L (ref 101–111)
CO2: 24 mmol/L (ref 22–32)
Calcium: 8.6 mg/dL — ABNORMAL LOW (ref 8.9–10.3)
Creatinine, Ser: 0.75 mg/dL (ref 0.44–1.00)
GFR calc non Af Amer: >60 mL/min (ref >60)
Glucose, Bld: 114 mg/dL — ABNORMAL HIGH (ref 65–99)
POTASSIUM: 3.7 mmol/L (ref 3.5–5.1)
Sodium: 139 mmol/L (ref 135–145)

## 2015-10-22 MED ORDER — CHLORHEXIDINE GLUCONATE CLOTH 2 % EX PADS
6.0000 | MEDICATED_PAD | Freq: Every day | CUTANEOUS | Status: DC
Start: 2015-10-22 — End: 2015-10-23
  Administered 2015-10-22 – 2015-10-23 (×2): 6 via TOPICAL

## 2015-10-22 MED ORDER — MUPIROCIN 2 % EX OINT
1.0000 "application " | TOPICAL_OINTMENT | Freq: Two times a day (BID) | CUTANEOUS | Status: DC
  Administered 2015-10-22 – 2015-10-23 (×3): 1 via NASAL
  Filled 2015-10-22: qty 22

## 2015-10-22 NOTE — Progress Notes (Addendum)
10/22/15 1131  PT Visit Information  Last PT Received On 10/22/15  Assistance Needed +1  History of Present Illness Pt admitted s/p fall with acute encephalopathy. Pt has Hx of dementia and previous falls. Possible R 4th rib Fx.  Precautions  Precautions Fall  Precaution Comments Hx of falls, pt is impulsive and confused. pelvic Fx s/p several weeks, acute rib Fx possible  Restrictions  Weight Bearing Restrictions Yes (WBAT )  Home Living  Family/patient expects to be discharged to: Skilled nursing facility  Additional Comments Pt able to follow directions and participate in and benefit from skilled therapy  Prior Function  Level of Independence Needs assistance  Comments Pt lived at home with family before sustaining a pelvic Fx several weeks ago, since that time pt has been confined to [facility] at assisted living facility.  Communication  Communication No difficulties  Pain Assessment  Pain Assessment Faces (Pt reports upeer back Px, but could not elaborate)  Faces Pain Scale 4  Pain Location Pt reports upeer back Px, but could not elaborate  Pain Intervention(s) Limited activity within patient's tolerance;Monitored during session;Repositioned  Cognition  Arousal/Alertness Awake/alert  Behavior During Therapy WFL for tasks assessed/performed  Overall Cognitive Status History of cognitive impairments - at baseline  Memory Decreased short-term memory  Upper Extremity Assessment  Upper Extremity Assessment Generalized weakness (Grossly 4/5)  Lower Extremity Assessment  Lower Extremity Assessment Generalized weakness (Grossly 4/5)  Cervical / Trunk Assessment  Cervical / Trunk Assessment Normal  Bed Mobility  Overal bed mobility Needs Assistance  Bed Mobility Supine to Sit  Supine to sit Min assist  General bed mobility comments Pt had strength and coordination to move to sitting independently, but required min assist due to impulsiveness and progressive posterior R lean that pt  was able to correct with verbal cuing  Transfers  Overall transfer level Needs assistance  Equipment used Rolling walker (2 wheeled)  Transfers Sit to/from Stand  Sit to Stand Min assist  General transfer comment Pt is able to stand with moderate effort due to balance and motor planning deficits; required min assist  Ambulation/Gait  Ambulation/Gait assistance Min assist  Ambulation Distance (Feet) 5 Feet  Assistive device Rolling walker (2 wheeled)  Gait Pattern/deviations Decreased stride length;Staggering right;Drifts right/left  General Gait Details Pt ambulated from bed to chair; displayed balance deficits with R bias and impaired hip and ankle stratagy in righting balance  Balance  Overall balance assessment History of Falls;Needs assistance  Sitting-balance support Feet supported (Pt had moderate R posterior bias in sitting; min assist)  Sitting balance-Leahy Scale Poor  Standing balance support Bilateral upper extremity supported  Standing balance-Leahy Scale Poor  Exercises  Exercises Other exercises  Other Exercises  Other Exercises B LE: heel slides X 10reps in supine, SLR x 10 reps in supine, LAQ X 10 reps in sitting, all with min assist   PT - End of Session  Equipment Utilized During Treatment Gait belt  Activity Tolerance Patient tolerated treatment well  Patient left in chair;with call bell/phone within reach;with bed alarm set  Nurse Communication Mobility status  PT Assessment  PT Therapy Diagnosis  Difficulty walking;Generalized weakness  PT Recommendation/Assessment Patient needs continued PT services  PT Problem List Decreased strength;Decreased balance;Decreased mobility;Decreased coordination;Decreased cognition;Decreased knowledge of use of DME;Decreased safety awareness;Pain  PT Plan  PT Frequency (ACUTE ONLY) QD  PT Treatment/Interventions (ACUTE ONLY) DME instruction;Gait training;Stair training;Functional mobility training;Therapeutic  activities;Therapeutic exercise;Balance training  PT Recommendation  Follow Up Recommendations SNF  PT equipment Rolling walker with 5" wheels  Individuals Consulted  Consulted and Agree with Results and Recommendations Other (Comment)  Acute Rehab PT Goals  Patient Stated Goal go home  PT Goal Formulation Patient unable to participate in goal setting  Time For Goal Achievement 11/05/15  Potential to Achieve Goals Good  PT Time Calculation  PT Start Time (ACUTE ONLY) 1054  PT Stop Time (ACUTE ONLY) 1120  PT Time Calculation (min) (ACUTE ONLY) 26 min  PT G-Codes **NOT FOR INPATIENT CLASS**  Functional Assessment Tool Used clinical judgement  Functional Limitation Mobility: Walking and moving around;Changing and maintaining body position  Mobility: Walking and Moving Around Current Status [phone](G8978) CJ  Mobility: Walking and Moving Around Goal Status [phone](G8979) CI  Changing and Maintaining Body Position Current Status (U9811(G8981) CJ  Changing and Maintaining Body Position Goal Status (B1478(G8982) CI  PT General Charges  $$ ACUTE PT VISIT 1 Procedure  PT Evaluation  $PT Eval Moderate Complexity 1 Procedure  PT Treatments  $Therapeutic Exercise 8-22 mins    Pt was a pleasant and cooperative 82 y/o female admitted after a fall with possible acute R ant 4th rib Fx and a pelvic Fx from several weeks prior. Pt was able to follow commands and was cooperative with mobility and transfers, although she was situationally confused and repeatedly asked the same questions every 4-5 minutes. She presents with general weakness 4-/5 assessed functionally. Pt needs min assist with bed mobility and transfers due to impulsive behavior and decreased balance. In sitting the pt had a R posterior lean that was correctable with verbal cues. Pt was able to conduct quiet standing with minimal lean with RW. She ambulated from bed to chair with obvious balance deficits with R bias, and limited ability to recover. Pt shows good  potential with skilled PT at this time to address her deficits with strength, balance, coordination, and safe use of DME. Recommend she receive therapy in a SNF. This entire session was guided, instructed, and directly supervised by Elizabeth PalauStephanie Ray, PT DPT  Cassell Smilesevan M Thu Baggett, SPT [phone]

## 2015-10-22 NOTE — Progress Notes (Signed)
Patient's son Fayrene FearingJames [facility]. Doug admissions coordinator at [facility] is aware of accepted bed offer. [facility] case [facility] is aware of above. Clinical Social Worker (CSW) will continue to follow and assist as needed.   Baker Hughes IncorporatedBailey Quetzal Meany, LCSW [phone]

## 2015-10-22 NOTE — Progress Notes (Addendum)
[facility] Per NT patient has not voided yet this shift. Bladder scan only showed 35mL. Patient resting quietly, no distress. Bed alarm on. Will continue to monitor.   **correction, NT updated RN that she was in error and patient has voided at least twice today.

## 2015-10-22 NOTE — Progress Notes (Signed)
[facility] -  at [facility]   PATIENT NAME: Michelle Ballard    MR#:  [id number]  DATE OF BIRTH:  [birth year]  SUBJECTIVE:  CHIEF COMPLAINT:   Chief Complaint  Patient presents with  . Fall     Found to have UTI, have rib fracture.   Pt have dementia, and is in good spirit,denies any complains.  REVIEW OF SYSTEMS:  Dementia- not able to give ROS.  ROS  DRUG ALLERGIES:  No Known Allergies  VITALS:  Blood pressure 128/49, pulse 80, temperature 98.8 F (37.1 C), temperature source Oral, resp. rate 18, height 5\' 10"  (1.778 m), weight 64.41 kg (142 lb), SpO2 94 %.  PHYSICAL EXAMINATION:  GENERAL:  82 y.o.-year-old patient lying in the bed with no acute distress.  EYES: Pupils equal, round, reactive to light and accommodation. No scleral icterus. Extraocular muscles intact.  HEENT: Head atraumatic, normocephalic. Oropharynx and nasopharynx clear.  NECK:  Supple, no jugular venous distention. No thyroid enlargement, no tenderness.  LUNGS: Normal breath sounds bilaterally, no wheezing, rales,rhonchi or crepitation. No use of accessory muscles of respiration. Tender on local palpation on right side. CARDIOVASCULAR: S1, S2 normal. No murmurs, rubs, or gallops.  ABDOMEN: Soft, nontender, nondistended. Bowel sounds present. No organomegaly or mass.  EXTREMITIES: No pedal edema, cyanosis, or clubbing.  NEUROLOGIC: Cranial nerves II through XII are intact. Muscle strength 4/5 in all extremities. Sensation intact. Gait not checked.  PSYCHIATRIC: The patient is alert and oriented to self.  SKIN: No obvious rash, lesion, or ulcer.   Physical Exam LABORATORY PANEL:   CBC  Recent Labs Lab 10/22/15 0553  WBC 8.7  HGB 11.5*  HCT 36.7  PLT 305   ------------------------------------------------------------------------------------------------------------------  Chemistries   Recent Labs Lab 10/22/15 0553  NA 139  K 3.7  CL 109  CO2 24  GLUCOSE 114*  BUN 18   CREATININE 0.75  CALCIUM 8.6*   ------------------------------------------------------------------------------------------------------------------  Cardiac Enzymes No results for input(s): TROPONINI in the last 168 hours. ------------------------------------------------------------------------------------------------------------------  RADIOLOGY:  Dg Ribs Unilateral Right  10/21/2015  CLINICAL DATA:  Fall.  Pain EXAM: RIGHT RIBS - 2 VIEW COMPARISON:  None. FINDINGS: Possible nondisplaced fracture right fourth rib anterolaterally. No other rib fracture. No effusion or pneumothorax. IMPRESSION: Possible nondisplaced fracture right fourth rib Electronically Signed   By: Marlan Palauharles  Clark M.D.   On: 10/21/2015 12:58   Dg Thoracic Spine 2 View  10/21/2015  CLINICAL DATA:  Unwitnessed fall.  Pain in the mid back.  Confusion. EXAM: THORACIC SPINE 2 VIEWS COMPARISON:  None. FINDINGS: Lower thoracic and lumbar scoliosis, centered at about T12 and convex towards the left. Diffuse bone demineralization. Diffuse degenerative changes throughout the thoracic spine. No anterior subluxation. No vertebral compression deformities. No paraspinal soft tissue swelling. Calcification of the aorta. IMPRESSION: Degenerative changes, demineralization, and scoliosis of the thoracic spine. No acute displaced fractures identified. Electronically Signed   By: Burman NievesWilliam  Stevens M.D.   On: 10/21/2015 02:55   Ct Head Wo Contrast  10/21/2015  CLINICAL DATA:  Unwitnessed fall.  Confusion. EXAM: CT HEAD WITHOUT CONTRAST CT CERVICAL SPINE WITHOUT CONTRAST TECHNIQUE: Multidetector CT imaging of the head and cervical spine was performed following the standard protocol without intravenous contrast. Multiplanar CT image reconstructions of the cervical spine were also generated. COMPARISON:  None. FINDINGS: CT HEAD FINDINGS Diffuse cerebral atrophy. Ventricular dilatation consistent with central atrophy. Low-attenuation changes in the deep  white matter consistent with small vessel ischemia. No mass effect or midline shift. No  abnormal extra-axial fluid collections. Gray-white matter junctions are distinct. Basal cisterns are not effaced. No evidence of acute intracranial hemorrhage. No depressed skull fractures. Visualized paranasal sinuses and mastoid air cells are not opacified. Vascular calcifications. CT CERVICAL SPINE FINDINGS There is mild reversal of the usual cervical lordosis with slight anterior subluxation of C2 on C3. Changes may be positional or degenerative but ligamentous injury is not excluded. Correlation physical examination recommended. Diffuse degenerative changes throughout the cervical spine. Normal alignment of the cervical facet joints with diffuse degenerative changes present. Uncovertebral spurring causes encroachment upon the neural foramina at multiple levels bilaterally. No vertebral compression deformities. No prevertebral soft tissue swelling. No focal bone lesion or bone destruction. C1-2 articulation appears intact. Scarring in the lung apices. Vascular calcifications. IMPRESSION: No acute intracranial abnormalities. Chronic atrophy and small vessel ischemic changes. Nonspecific reversal of the usual cervical lordosis with slight anterior subluxation at C2-3. Changes likely positional or degenerative but ligamentous injury is not excluded. Diffuse degenerative change throughout the cervical spine. No acute displaced fractures are identified. Electronically Signed   By: Burman Nieves M.D.   On: 10/21/2015 03:13   Ct Cervical Spine Wo Contrast  10/21/2015  CLINICAL DATA:  Unwitnessed fall.  Confusion. EXAM: CT HEAD WITHOUT CONTRAST CT CERVICAL SPINE WITHOUT CONTRAST TECHNIQUE: Multidetector CT imaging of the head and cervical spine was performed following the standard protocol without intravenous contrast. Multiplanar CT image reconstructions of the cervical spine were also generated. COMPARISON:  None. FINDINGS:  CT HEAD FINDINGS Diffuse cerebral atrophy. Ventricular dilatation consistent with central atrophy. Low-attenuation changes in the deep white matter consistent with small vessel ischemia. No mass effect or midline shift. No abnormal extra-axial fluid collections. Gray-white matter junctions are distinct. Basal cisterns are not effaced. No evidence of acute intracranial hemorrhage. No depressed skull fractures. Visualized paranasal sinuses and mastoid air cells are not opacified. Vascular calcifications. CT CERVICAL SPINE FINDINGS There is mild reversal of the usual cervical lordosis with slight anterior subluxation of C2 on C3. Changes may be positional or degenerative but ligamentous injury is not excluded. Correlation physical examination recommended. Diffuse degenerative changes throughout the cervical spine. Normal alignment of the cervical facet joints with diffuse degenerative changes present. Uncovertebral spurring causes encroachment upon the neural foramina at multiple levels bilaterally. No vertebral compression deformities. No prevertebral soft tissue swelling. No focal bone lesion or bone destruction. C1-2 articulation appears intact. Scarring in the lung apices. Vascular calcifications. IMPRESSION: No acute intracranial abnormalities. Chronic atrophy and small vessel ischemic changes. Nonspecific reversal of the usual cervical lordosis with slight anterior subluxation at C2-3. Changes likely positional or degenerative but ligamentous injury is not excluded. Diffuse degenerative change throughout the cervical spine. No acute displaced fractures are identified. Electronically Signed   By: Burman Nieves M.D.   On: 10/21/2015 03:13   Mr Cervical Spine Wo Contrast  10/21/2015  CLINICAL DATA:  Unwitnessed fall. Upper back pain. CT revealed some anterior subluxation at C2-3. EXAM: MRI CERVICAL SPINE WITHOUT CONTRAST TECHNIQUE: Multiplanar, multisequence MR imaging of the cervical spine was performed. No  intravenous contrast was administered. COMPARISON:  10/21/2015 CT scan FINDINGS: Despite efforts by the technologist and patient, severe motion artifact is present on today's exam and could not be eliminated. This reduces exam sensitivity and specificity. Alignment: 2 mm of grade 1 anterolisthesis at C2-3. 2 mm anterolisthesis at C7-T1. Vertebrae: No significant vertebral marrow edema is identified. There is disc desiccation throughout the cervical spine with loss of disc height most notable at the  C5-6 and C6-7 levels. Cord: Grossly unremarkable Posterior Fossa, vertebral arteries, paraspinal tissues: At the T1-2 level within and extending from the neural foramen the is a 1.9 by 1.3 by 1.0 cm structure with high T2 and low T1 signal characteristics similar to fluid. On the CT scan this had low density characteristics. Top differential diagnostic considerations include perineural cyst, neurofibroma/schwannoma, or less likely a posttraumatic pseudomeningocele. Disc levels: C2-3: No impingement. Left greater than right facet arthropathy likely accounting for the subluxation at this level. No definite periligamentous edema to favor a ligamentous injury at this level. C3-4: Prominent bilateral foraminal stenosis and moderate central narrowing of the thecal sac due to disc bulge, uncinate spurring, and facet arthropathy. C4-5: Mild to moderate bilateral foraminal stenosis and mild central narrowing of the thecal sac due to uncinate spurring, disc bulge, and facet arthropathy. C5-6: Moderate to prominent left and moderate right foraminal stenosis and mild central narrowing of the thecal sac due to disc bulge, uncinate spurring, and facet arthropathy. C6-7: Prominent bilateral foraminal stenosis and mild to moderate central narrowing of the thecal sac due to disc bulge, uncinate spurring, and facet arthropathy. C7-T1: No impingement. Disc uncovering and facet arthropathy. No periligamentous edema. T1-2: Fluid signal  intensity lesion in the left neural foramen as noted above. IMPRESSION: 1. Despite efforts by the technologist and patient, motion artifact is present on today's exam and could not be eliminated. This reduces exam sensitivity and specificity. 2. No periligamentous edema at C2-3 or C7-T1 to suggest ligamentous disruption. 3. 1.9 by 1.3 by 1.0 cm fluid signal intensity structure extending within and out from the left neural foramen at the T1-2 vertebral level. Differential diagnostic considerations include perineural cyst, neurofibroma/schwannoma, or less likely a posttraumatic pseudomeningocele. There is far too much motion artifact on today' s exam to assess the nerve roots in this vicinity for avulsion/injury, correlate with any specific findings to suggest brachial plexus injury on the left. In the nonacute setting, differentiation of perineural cyst from neurofibroma/schwannoma could be performed using post-contrast imaging to assess for enhancement. No definite widening of the neural foramen. 4. Cervical spondylosis and degenerative disc disease cause prominent impingement at C3-4 and C6-7 ; moderate to prominent impingement at C5-6; and mild to moderate impingement at C4-5. Electronically Signed   By: Gaylyn Rong M.D.   On: 10/21/2015 09:31    ASSESSMENT AND PLAN:   Active Problems:   Acute encephalopathy  1. Acute encephalopathy with history of dementia. Patient seems to be answering questions appropriately to me.    She have alzheimer's dementia. 2. Acute cystitis with hematuria.  Rocephin , Follow-up urine culture. 3. Back pain and right rib pain will x-ray the thoracic spine and right ribs confirms rib fracture.  4. Elevated blood pressure without diagnosis of hypertension. This is likely secondary to pain. Control pain first before given a blood pressure medications. 5. History of pelvic fractures. Was cleared by orthopedic to start weightbearing.   get a physical therapy  consultation. 6. Social worker consultation because the patient is from [facility].  All the records are reviewed and case discussed with Care Management/Social Workerr. Management plans discussed with the patient, family and they are in agreement.  CODE STATUS: Full  TOTAL TIME TAKING CARE OF THIS PATIENT: 35 minutes.   POSSIBLE D/C IN 1-2 DAYS, DEPENDING ON CLINICAL CONDITION. I called her son to discuss the plan.  Altamese Dilling M.D on 10/22/2015   Between 7am to 6pm - Pager - [phone]  After 6pm go to [email or web address] - password [email or web address]  [facility]  Office  [phone]  CC: Primary care physician; Barbette Reichmann, MD  Note: This dictation was prepared with Dragon dictation along with smaller phrase technology. Any transcriptional errors that result from this process are unintentional.

## 2015-10-22 NOTE — Clinical Social Work Placement (Signed)
   CLINICAL SOCIAL WORK PLACEMENT  NOTE  Date:  10/22/2015  Patient Details  Name: Michelle Ballard MRN: [id number] Date of Birth: 1930/04/26  Clinical Social Work is seeking post-discharge placement for this patient at the Skilled  Nursing Facility level of care (*CSW will initial, date and re-position this form in  chart as items are completed):  Yes   Patient/family provided with [place] Clinical Social Work Department's list of facilities offering this level of care within the geographic area requested by the patient (or if unable, by the patient's family).  Yes   Patient/family informed of their freedom to choose among providers that offer the needed level of care, that participate in Medicare, Medicaid or managed care program needed by the patient, have an available bed and are willing to accept the patient.  Yes   Patient/family informed of [facility]'s ownership interest in [facility] and [facility], as well as of the fact that they are under no obligation to receive care at these facilities.  PASRR submitted to EDS on 10/21/15     PASRR number received on 10/21/15     Existing PASRR number confirmed on       FL2 transmitted to all facilities in geographic area requested by pt/family on 10/22/15     FL2 transmitted to all facilities within larger geographic area on       Patient informed that his/her managed care company has contracts with or will negotiate with certain facilities, including the following:            Patient/family informed of bed offers received.  Patient chooses bed at       Physician recommends and patient chooses bed at      Patient to be transferred to   on  .  Patient to be transferred to facility by       Patient family notified on   of transfer.  Name of family member notified:        PHYSICIAN       Additional Comment:    _______________________________________________ Jhony Antrim, [name], LCSW 10/22/2015, 10:44 AM

## 2015-10-22 NOTE — Care Management Obs Status (Signed)
MEDICARE OBSERVATION STATUS NOTIFICATION   Patient Details  Name: Michelle Ballard MRN: [id number] Date of Birth: [birth year]   Medicare Observation Status Notification Given:  Yes    Adonis HugueninBerkhead, Jamerius Boeckman L, RN 10/22/2015, 9:57 AM

## 2015-10-22 NOTE — Progress Notes (Signed)
Family refusing bed alarm while they are at bedside. Educated on safety. Agreed to call for assistance if patient needed to get up, and to let us know when they leave so we can turn the alarm back on.

## 2015-10-22 NOTE — Care Management Note (Signed)
Case Management Note  Patient Details  Name: Michelle Ballard M Pitcock MRN: [id number] Date of Birth: [birth year]  Subjective/Objective:     Spoke with son Fayrene FearingJames via telephone. Patient  Had previous pelvic fracture ans was sent to ALF by physicians office. Had several fall there and was admitted observation with UTI and compression fractures.  Patient is confused.  Son stated that he had several questions about placement. Referred son to [facility] CSW who was able to field sons questions.            Action/Plan: Anticipated discharge plan is discharge to SNF.  CSW following and I will continue to follow for changing needs.    Expected Discharge Date:                  Expected Discharge Plan:  Skilled Nursing Facility  In-House Referral:  Clinical Social Work  Discharge planning Services  CM Consult  Post Acute Care Choice:    Choice offered to:     DME Arranged:    DME Agency:     HH Arranged:    HH Agency:     Status of Service:  In process, will continue to follow  If discussed at Long Length of Stay Meetings, dates discussed:    Additional Comments:  Adonis HugueninBerkhead, Corbin Hott L, RN 10/22/2015, 11:05 AM

## 2015-10-22 NOTE — Discharge Summary (Signed)
Family informed RN of selected Rehab selection to be : [facility] and [facility]. Family understands that the choice cannot usually be confirmed till date of discharge  (probably 7/14).

## 2015-10-22 NOTE — Progress Notes (Signed)
Clinical Child psychotherapistocial Worker (CSW) spoke with Psychologist, educationalLaverne staff member at [facility]. Per Laverne patient has been at ALF for 3 weeks, private pay and using a wheel chair. Per Laverne patient can return to ALF and will transition to memory care. CSW also spoke with patient's son Fayrene FearingJames. Per Fayrene FearingJames him and his brother Jonny RuizJohn share HPOA. Son reported that patient was placed at ALF after pelvic fracture and inquired about going to rehab. CSW explained that patient's insurance [facility] will have to approve SNF stay. Son verbalized his understanding and is agreeable [place]. PT is pending. CSW stated bed search. [facility] case [facility] is aware of above. CSW will continue to follow and assist as needed.   B[employer] Hughes IncorporatedBailey Gordana Kewley, LCSW [phone]

## 2015-10-22 NOTE — Progress Notes (Signed)
PT is recommending SNF. Clinical Child psychotherapistocial Worker (CSW) contacted patient's son Fayrene FearingJames and presented bed offers. Per son he is leaning towards [facility] however he will discuss it with his family and get back to CSW. CSW left list of bed offers in patient's room. CSW will continue to follow and assist as needed.   B[employer] Hughes [employer]orporatedBailey Hermon Zea, LCSW [phone]

## 2015-10-23 DIAGNOSIS — M25559 Pain in unspecified hip: Secondary | ICD-10-CM | POA: Diagnosis not present

## 2015-10-23 DIAGNOSIS — W19XXXD Unspecified fall, subsequent encounter: Secondary | ICD-10-CM | POA: Diagnosis not present

## 2015-10-23 DIAGNOSIS — F29 Unspecified psychosis not due to a substance or known physiological condition: Secondary | ICD-10-CM | POA: Diagnosis not present

## 2015-10-23 DIAGNOSIS — Z1639 Resistance to other specified antimicrobial drug: Secondary | ICD-10-CM | POA: Diagnosis not present

## 2015-10-23 DIAGNOSIS — N3001 Acute cystitis with hematuria: Secondary | ICD-10-CM | POA: Diagnosis not present

## 2015-10-23 DIAGNOSIS — F333 Major depressive disorder, recurrent, severe with psychotic symptoms: Secondary | ICD-10-CM | POA: Diagnosis not present

## 2015-10-23 DIAGNOSIS — N390 Urinary tract infection, site not specified: Secondary | ICD-10-CM | POA: Diagnosis not present

## 2015-10-23 DIAGNOSIS — F0390 Unspecified dementia without behavioral disturbance: Secondary | ICD-10-CM | POA: Diagnosis not present

## 2015-10-23 DIAGNOSIS — R0781 Pleurodynia: Secondary | ICD-10-CM | POA: Diagnosis not present

## 2015-10-23 DIAGNOSIS — G308 Other Alzheimer's disease: Secondary | ICD-10-CM | POA: Diagnosis not present

## 2015-10-23 DIAGNOSIS — G9340 Encephalopathy, unspecified: Secondary | ICD-10-CM | POA: Diagnosis not present

## 2015-10-23 DIAGNOSIS — Z743 Need for continuous supervision: Secondary | ICD-10-CM | POA: Diagnosis not present

## 2015-10-23 DIAGNOSIS — M545 Low back pain: Secondary | ICD-10-CM | POA: Diagnosis not present

## 2015-10-23 DIAGNOSIS — N3090 Cystitis, unspecified without hematuria: Secondary | ICD-10-CM | POA: Diagnosis not present

## 2015-10-23 DIAGNOSIS — S2231XD Fracture of one rib, right side, subsequent encounter for fracture with routine healing: Secondary | ICD-10-CM | POA: Diagnosis not present

## 2015-10-23 DIAGNOSIS — F0280 Dementia in other diseases classified elsewhere without behavioral disturbance: Secondary | ICD-10-CM | POA: Diagnosis not present

## 2015-10-23 DIAGNOSIS — F419 Anxiety disorder, unspecified: Secondary | ICD-10-CM | POA: Diagnosis not present

## 2015-10-23 DIAGNOSIS — B958 Unspecified staphylococcus as the cause of diseases classified elsewhere: Secondary | ICD-10-CM | POA: Diagnosis not present

## 2015-10-23 DIAGNOSIS — G309 Alzheimer's disease, unspecified: Secondary | ICD-10-CM | POA: Diagnosis not present

## 2015-10-23 DIAGNOSIS — Z841 Family history of disorders of kidney and ureter: Secondary | ICD-10-CM | POA: Diagnosis not present

## 2015-10-23 DIAGNOSIS — M549 Dorsalgia, unspecified: Secondary | ICD-10-CM | POA: Diagnosis not present

## 2015-10-23 DIAGNOSIS — N3000 Acute cystitis without hematuria: Secondary | ICD-10-CM | POA: Diagnosis not present

## 2015-10-23 DIAGNOSIS — R1010 Upper abdominal pain, unspecified: Secondary | ICD-10-CM | POA: Diagnosis not present

## 2015-10-23 DIAGNOSIS — R030 Elevated blood-pressure reading, without diagnosis of hypertension: Secondary | ICD-10-CM | POA: Diagnosis not present

## 2015-10-23 DIAGNOSIS — F329 Major depressive disorder, single episode, unspecified: Secondary | ICD-10-CM | POA: Diagnosis not present

## 2015-10-23 DIAGNOSIS — R7881 Bacteremia: Secondary | ICD-10-CM | POA: Diagnosis not present

## 2015-10-23 LAB — URINE CULTURE: Culture: 40000 — AB

## 2015-10-23 MED ORDER — CEFUROXIME AXETIL 250 MG PO TABS: 250.0000 mg | ORAL_TABLET | Freq: Two times a day (BID) | ORAL | Status: DC

## 2015-10-23 MED ORDER — HYDROCODONE-ACETAMINOPHEN 5-325 MG PO TABS: 0.5000 | ORAL_TABLET | Freq: Four times a day (QID) | ORAL | Status: DC | PRN

## 2015-10-23 NOTE — Discharge Planning (Signed)
Patient IVs removed.  DC papers printed, explained and educated - copy also placed in packet.  Going to [facility] s/w Tamala SerAngela Walker, LPN - Rm [id number].  Packet will be sent to facility and EMS contacted to transport. James/Son also contacted once EMS arrives.  RN assessment and VS revealed stability for DC to facility.

## 2015-10-23 NOTE — Progress Notes (Signed)
Patient is medically stable for D/C to [facility] today. [facility] at [facility] patient can come today to room 509. RN will call report to 500 hall RN and arrange EMS for transport. RN has agreed to call patient's son Fayrene FearingJames when EMS has arrived. [facility] authorization has been received. Auth # [id number]. Clinical Child psychotherapistocial Worker (CSW) sent D/C Summary, FL2 and D/C Packet to [employer] via [employer]. CSW contacted patient's son Fayrene FearingJames and made him aware of above. Please reconsult if future social work needs arise. CSW signing off.   [employer]orporatedBailey Gaddiel Cullens, LCSW [phone]

## 2015-10-23 NOTE — Discharge Summary (Signed)
[facility] [place] [facility]   PATIENT NAME: Michelle Ballard    MR#:  [id number]  DATE OF BIRTH:  1930/12/03  DATE OF ADMISSION:  10/21/2015 ADMITTING PHYSICIAN: Alford Highlandichard Wieting, MD  DATE OF DISCHARGE: 10/23/2015  PRIMARY CARE PHYSICIAN: Barbette ReichmannHANDE,VISHWANATH, MD    ADMISSION DIAGNOSIS:  Back pain [M54.9] UTI (lower urinary tract infection) [N39.0] Rib pain [R07.81] Fall, initial encounter [W19.XXXA] Altered mental status, unspecified altered mental status type [R41.82]  DISCHARGE DIAGNOSIS:  Active Problems:   Acute encephalopathy   UTI   Fall   Rib fracture  SECONDARY DIAGNOSIS:   Past Medical History  Diagnosis Date  . Dementia   . Multiple pelvic fractures (HCC)     HOSPITAL COURSE:   1. Acute encephalopathy with history of dementia. Patient seems to be answering questions appropriately to me.   She have alzheimer's dementia. Back to baseline as per family. 2. Acute cystitis with hematuria. Rocephin , Follow-up urine culture. 40,000 CFU of Staph aureus- Coagulase negative. Switch to oral Abx. 3. Back pain and right rib pain will x-ray the thoracic spine and right ribs confirms rib fracture.  4. Elevated blood pressure without diagnosis of hypertension. This is likely secondary to pain. Control pain first before given a blood pressure medications. BP Stable . 5. History of pelvic fractures. Was cleared by orthopedic to start weightbearing.  get a physical therapy consultation. 6. Social worker consultation because the [facility].  DISCHARGE CONDITIONS:   Stable.  CONSULTS OBTAINED:     DRUG ALLERGIES:  No Known Allergies  DISCHARGE MEDICATIONS:   Current Discharge Medication List    START taking these medications   Details  cefUROXime (CEFTIN) 250 MG tablet Take 1 tablet (250 mg total) by mouth 2 (two) times daily with a meal. Qty: 8 tablet, Refills: 0      CONTINUE these medications  which have CHANGED   Details  HYDROcodone-acetaminophen (NORCO/VICODIN) 5-325 MG tablet Take 0.5 tablets by mouth every 6 (six) hours as needed for moderate pain or severe pain. Qty: 20 tablet, Refills: 0      CONTINUE these medications which have NOT CHANGED   Details  diclofenac sodium (VOLTAREN) 1 % GEL Apply 2 g topically 2 (two) times daily.    donepezil (ARICEPT) 10 MG tablet Take 10 mg by mouth at bedtime.    !! LORazepam (ATIVAN) 0.5 MG tablet Take 0.5 mg by mouth 2 (two) times daily.    !! LORazepam (ATIVAN) 1 MG tablet Take 1 mg by mouth 2 (two) times daily as needed for anxiety.    memantine (NAMENDA) 10 MG tablet Take 10 mg by mouth 2 (two) times daily.    QUEtiapine (SEROQUEL) 25 MG tablet Take 25 mg by mouth at bedtime.    sertraline (ZOLOFT) 100 MG tablet Take 100 mg by mouth daily.     !! - Potential duplicate medications found. Please discuss with provider.       DISCHARGE INSTRUCTIONS:    Follow with PMD in 2 weeks.  If you experience worsening of your admission symptoms, develop shortness of breath, life threatening emergency, suicidal or homicidal thoughts you must seek medical attention immediately by calling 911 or calling your MD immediately  if symptoms less severe.  You Must read complete instructions/literature along with all the possible adverse reactions/side effects for all the Medicines you take and that have been prescribed to you. Take any new Medicines after you have completely understood and accept  all the possible adverse reactions/side effects.   Please note  You were cared for by a hospitalist during your hospital stay. If you have any questions about your discharge medications or the care you received while you were in the hospital after you are discharged, you can call the unit and asked to speak with the hospitalist on call if the hospitalist that took care of you is not available. Once you are discharged, your primary care physician will  handle any further medical issues. Please note that NO REFILLS for any discharge medications will be authorized once you are discharged, as it is imperative that you return to your primary care physician (or establish a relationship with a primary care physician if you do not have one) for your aftercare needs so that they can reassess your need for medications and monitor your lab values.    Today   CHIEF COMPLAINT:   Chief Complaint  Patient presents with  . Fall    HISTORY OF PRESENT ILLNESS:  Michelle Ballard  is a 82 y.o. female with a known history of Dementia. She had a fall last night. She is complaining of back pain and rib pain. The patient cannot tell me what happened with the fall secondary to dementia. Family thinks she is more confused than normal. She answers questions appropriately. In the ER she was found to have a urinary tract infection. In the ER she had multiple testing on her neck and ER physician spoke with neurosurgeon about the films and cleared her to take off the neck collar. Patient not complaining of any neck pain.    VITAL SIGNS:  Blood pressure 122/62, pulse 74, temperature 97.6 F (36.4 C), temperature source Oral, resp. rate 16, height 5\' 10"  (1.778 m), weight 64.41 kg (142 lb), SpO2 94 %.  I/O:  No intake or output data in the 24 hours ending 10/23/15 1000  PHYSICAL EXAMINATION:   GENERAL: 82 y.o.-year-old patient lying in the bed with no acute distress.  EYES: Pupils equal, round, reactive to light and accommodation. No scleral icterus. Extraocular muscles intact.  HEENT: Head atraumatic, normocephalic. Oropharynx and nasopharynx clear.  NECK: Supple, no jugular venous distention. No thyroid enlargement, no tenderness.  LUNGS: Normal breath sounds bilaterally, no wheezing, rales,rhonchi or crepitation. No use of accessory muscles of respiration. Tender on local palpation on right side. CARDIOVASCULAR: S1, S2 normal. No murmurs, rubs, or gallops.   ABDOMEN: Soft, nontender, nondistended. Bowel sounds present. No organomegaly or mass.  EXTREMITIES: No pedal edema, cyanosis, or clubbing.  NEUROLOGIC: Cranial nerves II through XII are intact. Muscle strength 4/5 in all extremities. Sensation intact. Gait not checked.  PSYCHIATRIC: The patient is alert and oriented to self.  SKIN: No obvious rash, lesion, or ulcer.   DATA REVIEW:   CBC  Recent Labs Lab 10/22/15 0553  WBC 8.7  HGB 11.5*  HCT 36.7  PLT 305    Chemistries   Recent Labs Lab 10/22/15 0553  NA 139  K 3.7  CL 109  CO2 24  GLUCOSE 114*  BUN 18  CREATININE 0.75  CALCIUM 8.6*    Cardiac Enzymes No results for input(s): TROPONINI in the last 168 hours.  Microbiology Results  Results for orders placed or performed during the hospital encounter of 10/21/15  Urine culture     Status: Abnormal   Collection Time: 10/21/15  1:11 AM  Result Value Ref Range Status   Specimen Description URINE, RANDOM  Final   Special Requests NONE  Final   Culture (A)  Final    40,000 COLONIES/mL STAPHYLOCOCCUS SPECIES (COAGULASE NEGATIVE)   Report Status 10/23/2015 FINAL  Final   Organism ID, Bacteria STAPHYLOCOCCUS SPECIES (COAGULASE NEGATIVE) (A)  Final      Susceptibility   Staphylococcus species (coagulase negative) - MIC*    CIPROFLOXACIN >=8 RESISTANT Resistant     GENTAMICIN <=0.5 SENSITIVE Sensitive     NITROFURANTOIN <=16 SENSITIVE Sensitive     OXACILLIN Value in next row Sensitive      <=0.25 SENSITIVEWARNING: For oxacillin-resistant S.aureus and coagulase-negative staphylococci (MRS), other beta-lactam agents, ie, penicillins, beta-lactam/beta-lactamase inhibitor combinations, cephems (with the exception of the cephalosporins with anti-MRSA activity), and carbapenems, may appear active in vitro, but are not effective clinically.  --CLSI, Vol.32 No.3, January 2012, pg 70.    TETRACYCLINE Value in next row Sensitive      <=0.25 SENSITIVEWARNING: For  oxacillin-resistant S.aureus and coagulase-negative staphylococci (MRS), other beta-lactam agents, ie, penicillins, beta-lactam/beta-lactamase inhibitor combinations, cephems (with the exception of the cephalosporins with anti-MRSA activity), and carbapenems, may appear active in vitro, but are not effective clinically.  --CLSI, Vol.32 No.3, January 2012, pg 70.    VANCOMYCIN Value in next row Sensitive      <=0.25 SENSITIVEWARNING: For oxacillin-resistant S.aureus and coagulase-negative staphylococci (MRS), other beta-lactam agents, ie, penicillins, beta-lactam/beta-lactamase inhibitor combinations, cephems (with the exception of the cephalosporins with anti-MRSA activity), and carbapenems, may appear active in vitro, but are not effective clinically.  --CLSI, Vol.32 No.3, January 2012, pg 70.    TRIMETH/SULFA Value in next row Sensitive      <=0.25 SENSITIVEWARNING: For oxacillin-resistant S.aureus and coagulase-negative staphylococci (MRS), other beta-lactam agents, ie, penicillins, beta-lactam/beta-lactamase inhibitor combinations, cephems (with the exception of the cephalosporins with anti-MRSA activity), and carbapenems, may appear active in vitro, but are not effective clinically.  --CLSI, Vol.32 No.3, January 2012, pg 70.    CLINDAMYCIN Value in next row Resistant      <=0.25 SENSITIVEWARNING: For oxacillin-resistant S.aureus and coagulase-negative staphylococci (MRS), other beta-lactam agents, ie, penicillins, beta-lactam/beta-lactamase inhibitor combinations, cephems (with the exception of the cephalosporins with anti-MRSA activity), and carbapenems, may appear active in vitro, but are not effective clinically.  --CLSI, Vol.32 No.3, January 2012, pg 70.    RIFAMPIN Value in next row Sensitive      <=0.25 SENSITIVEWARNING: For oxacillin-resistant S.aureus and coagulase-negative staphylococci (MRS), other beta-lactam agents, ie, penicillins, beta-lactam/beta-lactamase inhibitor combinations, cephems  (with the exception of the cephalosporins with anti-MRSA activity), and carbapenems, may appear active in vitro, but are not effective clinically.  --CLSI, Vol.32 No.3, January 2012, pg 70.    * 40,000 COLONIES/mL STAPHYLOCOCCUS SPECIES (COAGULASE NEGATIVE)  MRSA PCR Screening     Status: Abnormal   Collection Time: 10/21/15  2:51 PM  Result Value Ref Range Status   MRSA by PCR POSITIVE (A) NEGATIVE Final    Comment:        The GeneXpert MRSA Assay (FDA approved for NASAL specimens only), is one component of a comprehensive MRSA colonization surveillance program. It is not intended to diagnose MRSA infection nor to guide or monitor treatment for MRSA infections. RESULT CALLED TO, READ BACK BY AND VERIFIED WITH: MATTHEW WRIGHT AT 1625 10/21/15 MLZ      RADIOLOGY:  Dg Ribs Unilateral Right  10/21/2015  CLINICAL DATA:  Fall.  Pain EXAM: RIGHT RIBS - 2 VIEW COMPARISON:  None. FINDINGS: Possible nondisplaced fracture right fourth rib anterolaterally. No other rib fracture. No effusion or pneumothorax. IMPRESSION: Possible nondisplaced fracture right fourth rib  Electronically Signed   By: Marlan Palau M.D.   On: 10/21/2015 12:58    EKG:  No orders found for this or any previous visit.    Management plans discussed with the patient, family and they are in agreement.  CODE STATUS:     Code Status Orders        Start     Ordered   10/21/15 1157  Full code   Continuous     10/21/15 1157    Code Status History    Date Active Date Inactive Code Status Order ID Comments User Context   This patient has a current code status but no historical code status.    Advance Directive Documentation        Most Recent Value   Type of Advance Directive  Healthcare Power of Attorney   Pre-existing out of facility DNR order (yellow form or pink MOST form)     "MOST" Form in Place?        TOTAL TIME TAKING CARE OF THIS PATIENT: 35 minutes.    Altamese Dilling M.D on 10/23/2015 at  10:00 AM  Between 7am to 6pm - Pager - [phone]  After 6pm go to [email or web address] - password [employer]  [facility]  Office  [phone]  CC: Primary care physician; Barbette Reichmann, MD   Note: This dictation was prepared with Dragon dictation along with smaller phrase technology. Any transcriptional errors that result from this process are unintentional.

## 2015-10-23 NOTE — Clinical Social Work Placement (Signed)
   CLINICAL SOCIAL WORK PLACEMENT  NOTE  Date:  10/23/2015  Patient Details  Name: Michelle Ballard MRN: [id number] Date of Birth: [birth year]  Clinical Social Work is seeking post-discharge placement for this patient at the Skilled  Nursing Facility level of care (*CSW will initial, date and re-position this form in  chart as items are completed):  Yes   Patient/family provided with [place] Clinical Social Work Department's list of facilities offering this level of care within the geographic area requested by the patient (or if unable, by the patient's family).  Yes   Patient/family informed of their freedom to choose among providers that offer the needed level of care, that participate in Medicare, Medicaid or managed care program needed by the patient, have an available bed and are willing to accept the patient.  Yes   Patient/family informed of [place] [place]'s ownership interest in [facility] and [facility], as well as of the fact that they are under no obligation to receive care at these facilities.  PASRR submitted to EDS on 10/21/15     PASRR number received on 10/21/15     Existing PASRR number confirmed on       FL2 transmitted to all facilities in geographic area requested by pt/family on 10/22/15     FL2 transmitted to all facilities within larger geographic area on       Patient informed that his/her managed care company has contracts with or will negotiate with certain facilities, including the following:        Yes   Patient/family informed of bed offers received.  Patient chooses bed at  [facility]     Physician recommends and patient chooses bed at      Patient to be transferred to  [facility] ) on 10/23/15.  Patient to be transferred to facility by  [facility]  )     Patient family notified on 10/23/15 of transfer.  Name of family member notified:   (Patient's son Michelle Ballard is aware of D/C today.  )     PHYSICIAN        Additional Comment:    _______________________________________________ [name], [name], LCSW 10/23/2015, 12:22 PM

## 2015-10-28 DIAGNOSIS — G308 Other Alzheimer's disease: Secondary | ICD-10-CM | POA: Diagnosis not present

## 2015-10-28 DIAGNOSIS — N3090 Cystitis, unspecified without hematuria: Secondary | ICD-10-CM | POA: Diagnosis not present

## 2015-10-28 DIAGNOSIS — R0781 Pleurodynia: Secondary | ICD-10-CM | POA: Diagnosis not present

## 2015-10-28 DIAGNOSIS — R7881 Bacteremia: Secondary | ICD-10-CM | POA: Diagnosis not present

## 2015-10-28 DIAGNOSIS — G9340 Encephalopathy, unspecified: Secondary | ICD-10-CM | POA: Diagnosis not present

## 2015-10-28 DIAGNOSIS — M545 Low back pain: Secondary | ICD-10-CM | POA: Diagnosis not present

## 2015-10-30 ENCOUNTER — Other Ambulatory Visit: Payer: Self-pay | Admitting: Internal Medicine

## 2015-10-30 DIAGNOSIS — R102 Pelvic and perineal pain: Secondary | ICD-10-CM

## 2015-11-04 ENCOUNTER — Other Ambulatory Visit: Payer: Self-pay | Admitting: Internal Medicine

## 2015-11-04 DIAGNOSIS — I77811 Abdominal aortic ectasia: Secondary | ICD-10-CM

## 2015-11-06 ENCOUNTER — Ambulatory Visit: Payer: Commercial Managed Care - HMO

## 2015-11-13 ENCOUNTER — Emergency Department: Payer: Commercial Managed Care - HMO

## 2015-11-13 ENCOUNTER — Encounter: Payer: Self-pay | Admitting: Emergency Medicine

## 2015-11-13 ENCOUNTER — Inpatient Hospital Stay
Admission: EM | Admit: 2015-11-13 | Discharge: 2015-11-16 | DRG: 536 | Disposition: A | Discharge status: Skilled Nursing Facility | Payer: Commercial Managed Care - HMO | Attending: Internal Medicine | Admitting: Internal Medicine

## 2015-11-13 DIAGNOSIS — Y92098 Other place in other non-institutional residence as the place of occurrence of the external cause: Secondary | ICD-10-CM

## 2015-11-13 DIAGNOSIS — S3282XA Multiple fractures of pelvis without disruption of pelvic ring, initial encounter for closed fracture: Secondary | ICD-10-CM | POA: Diagnosis not present

## 2015-11-13 DIAGNOSIS — R4789 Other speech disturbances: Secondary | ICD-10-CM | POA: Diagnosis not present

## 2015-11-13 DIAGNOSIS — E876 Hypokalemia: Secondary | ICD-10-CM

## 2015-11-13 DIAGNOSIS — S329XXA Fracture of unspecified parts of lumbosacral spine and pelvis, initial encounter for closed fracture: Secondary | ICD-10-CM | POA: Diagnosis present

## 2015-11-13 DIAGNOSIS — F0391 Unspecified dementia with behavioral disturbance: Secondary | ICD-10-CM | POA: Diagnosis present

## 2015-11-13 DIAGNOSIS — W050XXA Fall from non-moving wheelchair, initial encounter: Secondary | ICD-10-CM | POA: Diagnosis present

## 2015-11-13 DIAGNOSIS — S32810A Multiple fractures of pelvis with stable disruption of pelvic ring, initial encounter for closed fracture: Secondary | ICD-10-CM

## 2015-11-13 DIAGNOSIS — Z79899 Other long term (current) drug therapy: Secondary | ICD-10-CM | POA: Diagnosis not present

## 2015-11-13 DIAGNOSIS — S79911A Unspecified injury of right hip, initial encounter: Secondary | ICD-10-CM | POA: Diagnosis not present

## 2015-11-13 DIAGNOSIS — M25559 Pain in unspecified hip: Secondary | ICD-10-CM | POA: Diagnosis not present

## 2015-11-13 DIAGNOSIS — W19XXXA Unspecified fall, initial encounter: Secondary | ICD-10-CM

## 2015-11-13 DIAGNOSIS — S0990XA Unspecified injury of head, initial encounter: Secondary | ICD-10-CM | POA: Diagnosis not present

## 2015-11-13 DIAGNOSIS — R262 Difficulty in walking, not elsewhere classified: Secondary | ICD-10-CM | POA: Diagnosis not present

## 2015-11-13 DIAGNOSIS — S299XXA Unspecified injury of thorax, initial encounter: Secondary | ICD-10-CM | POA: Diagnosis not present

## 2015-11-13 DIAGNOSIS — F0390 Unspecified dementia without behavioral disturbance: Secondary | ICD-10-CM | POA: Diagnosis not present

## 2015-11-13 DIAGNOSIS — R079 Chest pain, unspecified: Secondary | ICD-10-CM | POA: Diagnosis not present

## 2015-11-13 DIAGNOSIS — Z9181 History of falling: Secondary | ICD-10-CM | POA: Diagnosis not present

## 2015-11-13 DIAGNOSIS — Z87891 Personal history of nicotine dependence: Secondary | ICD-10-CM | POA: Diagnosis not present

## 2015-11-13 DIAGNOSIS — S32810D Multiple fractures of pelvis with stable disruption of pelvic ring, subsequent encounter for fracture with routine healing: Secondary | ICD-10-CM | POA: Diagnosis not present

## 2015-11-13 DIAGNOSIS — Z743 Need for continuous supervision: Secondary | ICD-10-CM | POA: Diagnosis not present

## 2015-11-13 DIAGNOSIS — R109 Unspecified abdominal pain: Secondary | ICD-10-CM | POA: Diagnosis not present

## 2015-11-13 DIAGNOSIS — R102 Pelvic and perineal pain: Secondary | ICD-10-CM | POA: Diagnosis not present

## 2015-11-13 DIAGNOSIS — M810 Age-related osteoporosis without current pathological fracture: Secondary | ICD-10-CM | POA: Diagnosis present

## 2015-11-13 DIAGNOSIS — D649 Anemia, unspecified: Secondary | ICD-10-CM | POA: Diagnosis not present

## 2015-11-13 DIAGNOSIS — M6281 Muscle weakness (generalized): Secondary | ICD-10-CM | POA: Diagnosis not present

## 2015-11-13 DIAGNOSIS — R296 Repeated falls: Secondary | ICD-10-CM | POA: Diagnosis present

## 2015-11-13 DIAGNOSIS — S3991XA Unspecified injury of abdomen, initial encounter: Secondary | ICD-10-CM | POA: Diagnosis not present

## 2015-11-13 DIAGNOSIS — R630 Anorexia: Secondary | ICD-10-CM

## 2015-11-13 DIAGNOSIS — S199XXA Unspecified injury of neck, initial encounter: Secondary | ICD-10-CM | POA: Diagnosis not present

## 2015-11-13 DIAGNOSIS — S32512A Fracture of superior rim of left pubis, initial encounter for closed fracture: Secondary | ICD-10-CM | POA: Diagnosis not present

## 2015-11-13 DIAGNOSIS — F03918 Unspecified dementia, unspecified severity, with other behavioral disturbance: Secondary | ICD-10-CM

## 2015-11-13 DIAGNOSIS — M25551 Pain in right hip: Secondary | ICD-10-CM | POA: Diagnosis not present

## 2015-11-13 LAB — CBC
HEMATOCRIT: 35.2 % (ref 35.0–47.0)
HEMOGLOBIN: 11.3 g/dL — AB (ref 12.0–16.0)
MCH: 19.5 pg — ABNORMAL LOW (ref 26.0–34.0)
MCHC: 32 g/dL (ref 32.0–36.0)
MCV: 61 fL — ABNORMAL LOW (ref 80.0–100.0)
Platelets: 289 10*3/uL (ref 150–440)
RBC: 5.78 MIL/uL — AB (ref 3.80–5.20)
RDW: 17.2 % — ABNORMAL HIGH (ref 11.5–14.5)
WBC: 10.7 10*3/uL (ref 3.6–11.0)

## 2015-11-13 LAB — BASIC METABOLIC PANEL
Anion gap: 8 (ref 5–15)
BUN: 16 mg/dL (ref 6–20)
CALCIUM: 8.9 mg/dL (ref 8.9–10.3)
CHLORIDE: 108 mmol/L (ref 101–111)
CO2: 25 mmol/L (ref 22–32)
Creatinine, Ser: 0.84 mg/dL (ref 0.44–1.00)
GFR calc Af Amer: >60 mL/min (ref >60)
GLUCOSE: 134 mg/dL — AB (ref 65–99)
Potassium: 3.4 mmol/L — ABNORMAL LOW (ref 3.5–5.1)
SODIUM: 141 mmol/L (ref 135–145)

## 2015-11-13 LAB — URINALYSIS COMPLETE WITH MICROSCOPIC (ARMC ONLY)
BACTERIA UA: NONE SEEN
Bilirubin Urine: NEGATIVE
GLUCOSE, UA: NEGATIVE mg/dL
Hgb urine dipstick: NEGATIVE
KETONES UR: NEGATIVE mg/dL
Leukocytes, UA: NEGATIVE
NITRITE: NEGATIVE
PROTEIN: NEGATIVE mg/dL
SPECIFIC GRAVITY, URINE: 1.019 (ref 1.005–1.030)
pH: 5 (ref 5.0–8.0)

## 2015-11-13 MED ORDER — POLYETHYLENE GLYCOL 3350 17 G PO PACK: 17.0000 g | PACK | Freq: Every day | ORAL | Status: DC | PRN

## 2015-11-13 MED ORDER — OXYCODONE HCL 5 MG PO TABS
5.0000 mg | ORAL_TABLET | ORAL | Status: DC | PRN
  Administered 2015-11-14 – 2015-11-15 (×5): 5 mg via ORAL
  Filled 2015-11-13 (×6): qty 1

## 2015-11-13 MED ORDER — ONDANSETRON HCL 4 MG/2ML IJ SOLN: 4.0000 mg | Freq: Four times a day (QID) | INTRAMUSCULAR | Status: DC | PRN

## 2015-11-13 MED ORDER — FENTANYL CITRATE (PF) 100 MCG/2ML IJ SOLN
25.0000 ug | Freq: Once | INTRAMUSCULAR | Status: AC
  Administered 2015-11-13: 25 ug via INTRAVENOUS
  Filled 2015-11-13: qty 2

## 2015-11-13 MED ORDER — ENOXAPARIN SODIUM 40 MG/0.4ML ~~LOC~~ SOLN
40.0000 mg | SUBCUTANEOUS | Status: DC
  Administered 2015-11-13 – 2015-11-15 (×3): 40 mg via SUBCUTANEOUS
  Filled 2015-11-13 (×3): qty 0.4

## 2015-11-13 MED ORDER — SERTRALINE HCL 100 MG PO TABS
100.0000 mg | ORAL_TABLET | Freq: Every day | ORAL | Status: DC
  Administered 2015-11-14 – 2015-11-16 (×3): 100 mg via ORAL
  Filled 2015-11-13 (×3): qty 1

## 2015-11-13 MED ORDER — DONEPEZIL HCL 5 MG PO TABS
10.0000 mg | ORAL_TABLET | Freq: Every day | ORAL | Status: DC
  Administered 2015-11-13 – 2015-11-16 (×4): 10 mg via ORAL
  Filled 2015-11-13 (×4): qty 2

## 2015-11-13 MED ORDER — ACETAMINOPHEN 650 MG RE SUPP: 650.0000 mg | Freq: Four times a day (QID) | RECTAL | Status: DC | PRN

## 2015-11-13 MED ORDER — DICLOFENAC SODIUM 1 % TD GEL
2.0000 g | Freq: Two times a day (BID) | TRANSDERMAL | Status: DC
  Administered 2015-11-14 – 2015-11-16 (×4): 2 g via TOPICAL
  Filled 2015-11-13: qty 100

## 2015-11-13 MED ORDER — IBUPROFEN 400 MG PO TABS
400.0000 mg | ORAL_TABLET | Freq: Once | ORAL | Status: AC
  Administered 2015-11-13: 400 mg via ORAL
  Filled 2015-11-13: qty 1

## 2015-11-13 MED ORDER — QUETIAPINE FUMARATE 25 MG PO TABS
25.0000 mg | ORAL_TABLET | Freq: Every day | ORAL | Status: DC
  Administered 2015-11-13 – 2015-11-15 (×3): 25 mg via ORAL
  Filled 2015-11-13 (×3): qty 1

## 2015-11-13 MED ORDER — ONDANSETRON HCL 4 MG PO TABS: 4.0000 mg | ORAL_TABLET | Freq: Four times a day (QID) | ORAL | Status: DC | PRN

## 2015-11-13 MED ORDER — ACETAMINOPHEN 325 MG PO TABS: 650.0000 mg | ORAL_TABLET | Freq: Four times a day (QID) | ORAL | Status: DC | PRN

## 2015-11-13 MED ORDER — DOCUSATE SODIUM 100 MG PO CAPS
100.0000 mg | ORAL_CAPSULE | Freq: Two times a day (BID) | ORAL | Status: DC
  Administered 2015-11-13 – 2015-11-16 (×5): 100 mg via ORAL
  Filled 2015-11-13 (×5): qty 1

## 2015-11-13 MED ORDER — MEMANTINE HCL 5 MG PO TABS
10.0000 mg | ORAL_TABLET | Freq: Two times a day (BID) | ORAL | Status: DC
  Administered 2015-11-13 – 2015-11-16 (×6): 10 mg via ORAL
  Filled 2015-11-13 (×7): qty 2

## 2015-11-13 MED ORDER — MORPHINE SULFATE (PF) 2 MG/ML IV SOLN: 2.0000 mg | INTRAVENOUS | Status: DC | PRN

## 2015-11-13 NOTE — ED Notes (Signed)
Patient son at bedside. Son was told by [facility] staff at patient hit her head and that this is patients second fall in a month. MD notified.

## 2015-11-13 NOTE — ED Notes (Signed)
Soiled brief and linens changed at this time. Meal tray and water given.

## 2015-11-13 NOTE — ED Notes (Signed)
Patient is resting more comfortably. Patient is no longer shivering.

## 2015-11-13 NOTE — ED Notes (Signed)
Pt not ambulatory.  Unable to bare weight

## 2015-11-13 NOTE — H&P (Signed)
[facility] - [place] [facility]   PATIENT NAME: Michelle Ballard    MR#:  [id number]  DATE OF BIRTH:  January 26, 1931   DATE OF ADMISSION:  11/13/2015  PRIMARY CARE PHYSICIAN: Barbette Reichmann, MD   REQUESTING/REFERRING PHYSICIAN: Lord  CHIEF COMPLAINT:   Chief Complaint  Patient presents with  . Fall    HISTORY OF PRESENT ILLNESS:  Michelle Ballard  is a 82 y.o. female with a known history of Dementia, recurrent falls who is presenting after mechanical fall. The patient is pleasantly demented and during the conversation she stated "Oh no who fell... Did they break something?" Found to have multiple pelvic fractures. When trying to move experiencing extreme pain. Wheelchair baseline, fell out of chair at the dinner table.   PAST MEDICAL HISTORY:   Past Medical History:  Diagnosis Date  . Dementia   . Multiple pelvic fractures (HCC)     PAST SURGICAL HISTORY:   Past Surgical History:  Procedure Laterality Date  . NO PAST SURGERIES      SOCIAL HISTORY:   Social History  Substance Use Topics  . Smoking status: Former Games developer  . Smokeless tobacco: Never Used  . Alcohol use No    FAMILY HISTORY:   Family History  Problem Relation Age of Onset  . Lung cancer Mother   . Lung cancer Father   . Cirrhosis Brother     DRUG ALLERGIES:  No Known Allergies  REVIEW OF SYSTEMS:  REVIEW OF SYSTEMS: Poor historian, sweetly demented CONSTITUTIONAL: Denies fevers, chills, fatigue, weakness.  EYES: Denies blurred vision, double vision, or eye pain.  EARS, NOSE, THROAT: Denies tinnitus, ear pain, hearing loss.  RESPIRATORY: denies cough, shortness of breath, wheezing  CARDIOVASCULAR: Denies chest pain, palpitations, edema.  GASTROINTESTINAL: Denies nausea, vomiting, diarrhea, abdominal pain.  GENITOURINARY: Denies dysuria, hematuria.  ENDOCRINE: Denies nocturia or thyroid problems. HEMATOLOGIC AND LYMPHATIC: Denies easy bruising or bleeding.  SKIN: Denies rash  or lesions.  MUSCULOSKELETAL: positive back pain Denies pain in neck,, shoulder, knees, hips, or further arthritic symptoms.  NEUROLOGIC: Denies paralysis, paresthesias.  PSYCHIATRIC: Denies anxiety or depressive symptoms. Otherwise full review of systems performed by me is negative.   MEDICATIONS AT HOME:   Prior to Admission medications   Medication Sig Start Date End Date Taking? Authorizing Provider  diclofenac sodium (VOLTAREN) 1 % GEL Apply 2 g topically 2 (two) times daily.   Yes Historical Provider, MD  donepezil (ARICEPT) 10 MG tablet Take 10 mg by mouth daily.    Yes Historical Provider, MD  LORazepam (ATIVAN) 1 MG tablet Take 1 mg by mouth 2 (two) times daily as needed for anxiety.   Yes Historical Provider, MD  memantine (NAMENDA) 10 MG tablet Take 10 mg by mouth 2 (two) times daily.   Yes Historical Provider, MD  QUEtiapine (SEROQUEL) 25 MG tablet Take 25 mg by mouth at bedtime.   Yes Historical Provider, MD  sertraline (ZOLOFT) 100 MG tablet Take 100 mg by mouth daily.   Yes Historical Provider, MD  HYDROcodone-acetaminophen (NORCO/VICODIN) 5-325 MG tablet Take 0.5 tablets by mouth every 6 (six) hours as needed for moderate pain or severe pain. 10/23/15   Altamese Dilling, MD  LORazepam (ATIVAN) 0.5 MG tablet Take 0.5 mg by mouth 2 (two) times daily.    Historical Provider, MD      VITAL SIGNS:  Blood pressure (!) 154/68, pulse 78, temperature 97.7 F (36.5 C), temperature source Oral, resp. rate 17, height 5\' 4"  (1.626 m), weight  145 lb (65.8 kg), SpO2 96 %.  PHYSICAL EXAMINATION:  VITAL SIGNS: Vitals:   11/13/15 1900 11/13/15 1930  BP: (!) 130/57 (!) 154/68  Pulse: 73 78  Resp: 18 17  Temp:     GENERAL:82 y.o.female currently in no acute distress.  HEAD: Normocephalic, atraumatic.  EYES: Pupils equal, round, reactive to light. Extraocular muscles intact. No scleral icterus.  MOUTH: Moist mucosal membrane. Dentition intact. No abscess noted.  EAR, NOSE,  THROAT: Clear without exudates. No external lesions.  NECK: Supple. No thyromegaly. No nodules. No JVD.  PULMONARY: Clear to ascultation, without wheeze rails or rhonci. No use of accessory muscles, Good respiratory effort. good air entry bilaterally CHEST: Nontender to palpation.  CARDIOVASCULAR: S1 and S2. Regular rate and rhythm. No murmurs, rubs, or gallops. No edema. Pedal pulses 2+ bilaterally.  GASTROINTESTINAL: Soft, nontender, nondistended. No masses. Positive bowel sounds. No hepatosplenomegaly.  MUSCULOSKELETAL: No swelling, clubbing, or edema. Range of motion full in all extremities.  NEUROLOGIC: Cranial nerves II through XII are intact. No gross focal neurological deficits. Sensation intact. Reflexes intact.  SKIN: No ulceration, lesions, rashes, or cyanosis. Skin warm and dry. Turgor intact.  PSYCHIATRIC: sweetly Demented,  Mood, affect pleasantly confused. The patient is awake, alert and oriented x 1. Insight, judgment poor.    LABORATORY PANEL:   CBC  Recent Labs Lab 11/13/15 1321  WBC 10.7  HGB 11.3*  HCT 35.2  PLT 289   ------------------------------------------------------------------------------------------------------------------  Chemistries   Recent Labs Lab 11/13/15 1321  NA 141  K 3.4*  CL 108  CO2 25  GLUCOSE 134*  BUN 16  CREATININE 0.84  CALCIUM 8.9   ------------------------------------------------------------------------------------------------------------------  Cardiac Enzymes No results for input(s): TROPONINI in the last 168 hours. ------------------------------------------------------------------------------------------------------------------  RADIOLOGY:  Dg Chest 1 View  Result Date: 11/13/2015 CLINICAL DATA:  Pain following fall EXAM: CHEST 1 VIEW COMPARISON:  None. FINDINGS: There is no edema or consolidation. The heart size and pulmonary vascularity are normal. No adenopathy. There is atherosclerotic calcification in the aorta.  No pneumothorax. No fracture evident. There is lower thoracic levoscoliosis. IMPRESSION: No edema or consolidation. No pneumothorax. Aortic atherosclerosis. Electronically Signed   By: Bretta Bang III M.D.   On: 11/13/2015 14:39   Ct Head Wo Contrast  Result Date: 11/13/2015 CLINICAL DATA:  82 year old female with witnessed fall striking head. Hip and leg pain. Initial encounter. EXAM: CT HEAD WITHOUT CONTRAST CT CERVICAL SPINE WITHOUT CONTRAST TECHNIQUE: Multidetector CT imaging of the head and cervical spine was performed following the standard protocol without intravenous contrast. Multiplanar CT image reconstructions of the cervical spine were also generated. COMPARISON:  Head face and spine CT 10/21/2015. Cervical spine MRI 10/21/2015. FINDINGS: CT HEAD FINDINGS Visualized paranasal sinuses and mastoids are stable and well pneumatized. No scalp hematoma. Visualized orbit soft tissues are within normal limits. Osteopenia. Calvarium stable and intact. Calcified atherosclerosis at the skull base. Stable cerebral volume. Stable patchy bilateral cerebral white matter hypodensity. No midline shift, mass effect, or evidence of intracranial mass lesion. No ventriculomegaly. No acute intracranial hemorrhage identified. No cortically based acute infarct identified. No suspicious intracranial vascular hyperdensity. CT CERVICAL SPINE FINDINGS Osteopenia. Reversal of cervical lordosis re- demonstrated with stable mild anterolisthesis at C2-C3, C4-C5 and C7-T1. Visualized skull base is intact. No atlanto-occipital dissociation. Stable bilateral posterior element alignment. Widespread advanced cervical disc and endplate degeneration. Widespread advanced left side cervical facet degeneration. No cervical spine fracture. Visible upper thoracic levels appear stable and intact. Stable visible lung apices with apical  scarring. Calcified carotid atherosclerosis. Otherwise negative noncontrast neck soft tissues. IMPRESSION:  1. No acute intracranial abnormality and stable non contrast CT appearance of the brain. 2. No acute fracture or listhesis identified in the cervical spine. Ligamentous injury is not excluded. Electronically Signed   By: Odessa Fleming M.D.   On: 11/13/2015 14:53   Ct Cervical Spine Wo Contrast  Result Date: 11/13/2015 CLINICAL DATA:  82 year old female with witnessed fall striking head. Hip and leg pain. Initial encounter. EXAM: CT HEAD WITHOUT CONTRAST CT CERVICAL SPINE WITHOUT CONTRAST TECHNIQUE: Multidetector CT imaging of the head and cervical spine was performed following the standard protocol without intravenous contrast. Multiplanar CT image reconstructions of the cervical spine were also generated. COMPARISON:  Head face and spine CT 10/21/2015. Cervical spine MRI 10/21/2015. FINDINGS: CT HEAD FINDINGS Visualized paranasal sinuses and mastoids are stable and well pneumatized. No scalp hematoma. Visualized orbit soft tissues are within normal limits. Osteopenia. Calvarium stable and intact. Calcified atherosclerosis at the skull base. Stable cerebral volume. Stable patchy bilateral cerebral white matter hypodensity. No midline shift, mass effect, or evidence of intracranial mass lesion. No ventriculomegaly. No acute intracranial hemorrhage identified. No cortically based acute infarct identified. No suspicious intracranial vascular hyperdensity. CT CERVICAL SPINE FINDINGS Osteopenia. Reversal of cervical lordosis re- demonstrated with stable mild anterolisthesis at C2-C3, C4-C5 and C7-T1. Visualized skull base is intact. No atlanto-occipital dissociation. Stable bilateral posterior element alignment. Widespread advanced cervical disc and endplate degeneration. Widespread advanced left side cervical facet degeneration. No cervical spine fracture. Visible upper thoracic levels appear stable and intact. Stable visible lung apices with apical scarring. Calcified carotid atherosclerosis. Otherwise negative  noncontrast neck soft tissues. IMPRESSION: 1. No acute intracranial abnormality and stable non contrast CT appearance of the brain. 2. No acute fracture or listhesis identified in the cervical spine. Ligamentous injury is not excluded. Electronically Signed   By: Odessa Fleming M.D.   On: 11/13/2015 14:53   Ct Pelvis Wo Contrast  Result Date: 11/13/2015 CLINICAL DATA:  82 year old female with witnessed fall. Bilateral hip pain and pain radiating distally in the legs. Unable to weightbear. Initial encounter. EXAM: CT PELVIS WITHOUT CONTRAST TECHNIQUE: Multidetector CT imaging of the pelvis was performed following the standard protocol without intravenous contrast. COMPARISON:  Right hip series 1414 hours today. Pelvis MRI 09/17/2015. FINDINGS: Bilateral sacral insufficiency fractures are present, as seen on the June MRI. There is associated sclerosis of the sacral ala. Elsewhere there is osteopenia. There is hematoma along the space of Retzius ventral to the urinary bladder and tracking to the left pelvic floor with associated intramuscular hematoma along the left pelvic sidewall. This is new since June and appears to be associated with a comminuted minimally displaced fracture of the left superior pubic ramus which extends into the symphysis (coronal image 28), and a more simple appearing oblique fracture of the left inferior pubic ramus (series 3, image 102). There was some pubic symphysis marrow edema in June. The left acetabulum and proximal femur appear intact. There is a superimposed subtle nondisplaced fracture of the right superior pubic ramus seen only on coronal image 36. This does not appear to extend into the symphysis. There is a chronic appearing minimally displaced right inferior pubic ramus fracture with sclerosis. The right acetabulum and proximal right femur appear intact. Mild mass effect on the urinary bladder. No pelvic free fluid. Decompressed distal large and small bowel loops. Aortoiliac calcified  atherosclerosis noted. Large 9.5 cm cystic mass located in the superior left pelvis appears  to be arising from the left adnexa (on series 10, image 63) and has simple fluid densitometry. This is stable since June. No lymphadenopathy. IMPRESSION: 1. Partially comminuted minimally displaced fractures of the left superior and inferior pubic rami (the former extending into the pubic symphysis) are associated with mild posttraumatic hematoma along the left pelvic sidewall and pelvic floor. 2. Superimposed acute nondisplaced right superior pubic ramus fracture. 3. Superimposed subacute bilateral sacral insufficiency fractures (diagnosed by MRI in June). 4. Chronic right inferior pubic ramus fracture. 5. No acetabular or proximal femur fractures. 6. Large 9 cm left adnexal cystic mass again noted with consensus criteria recommending Pelvic Ultrasound for further characterization. 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 7.  Calcified aortic atherosclerosis. Electronically Signed   By: Odessa Fleming M.D.   On: 11/13/2015 18:40   Dg Hip Unilat W Or Wo Pelvis 2-3 Views Right  Result Date: 11/13/2015 CLINICAL DATA:  Pain following fall EXAM: DG HIP (WITH OR WITHOUT PELVIS) 2-3V RIGHT COMPARISON:  None. FINDINGS: Frontal pelvis as well as frontal and lateral right hip images were obtained. There is evidence of an old healed fracture of the left superior pubic ramus. There is a prior fracture of the medial left ischium, age uncertain but probably nonacute. There is no demonstrable acute fracture or dislocation. There is moderate symmetric narrowing of both hip joints. No erosive change. Bones are osteoporotic. IMPRESSION: Bones osteoporotic. Old healed fracture superior pubic ramus on the left. Age uncertain but probable chronic fracture medial left ischium in anatomic alignment. No acute fracture or dislocation. Symmetric narrowing both hip joints. Electronically Signed   By: Bretta Bang III M.D.   On: 11/13/2015  14:38    EKG:   Orders placed or performed during the hospital encounter of 11/13/15  . ED EKG  . ED EKG  . EKG 12-Lead  . EKG 12-Lead    IMPRESSION AND PLAN:   82 year old Caucasian female history of dementia and recurrent falls presenting after mechanical fall from wheelchair.  1. Intractable pain secondary to pelvic pain.  1. Partially comminuted minimally displaced fractures of the left superior and inferior pubic rami (the former extending into the pubic symphysis) are associated with mild posttraumatic hematoma along the left pelvic sidewall and pelvic floor. 2. Superimposed acute nondisplaced right superior pubic ramus fracture. 3. Superimposed subacute bilateral sacral insufficiency fractures (diagnosed by MRI in June). 4. Chronic right inferior pubic ramus fracture.  Orthopedic consult, pain control,   2. Dementia: Donepezil 3. Venous thromboembolism prophylactic: Lovenox    All the records are reviewed and case discussed with ED provider. Management plans discussed with the patient, family and they are in agreement.  CODE STATUS: full  TOTAL TIME TAKING CARE OF THIS PATIENT: 33 minutes.    Hower,  Mardi Mainland.D on 11/13/2015 at 8:17 PM  Between 7am to 6pm - Pager - [phone]  After 6pm: House Pager: - [phone]  [facility]  Hospitalists  Office  [phone]  CC: Primary care physician; Barbette Reichmann, MD

## 2015-11-13 NOTE — ED Notes (Addendum)
Yellow socks and yellow arm band placed on patient. Bed alarm on. Bed locked and in lowest position. Call bell within reach. Patient oriented on call bell and instructed not to get out of bed. Verbalizes understanding.

## 2015-11-13 NOTE — ED Provider Notes (Addendum)
[facility] Emergency Department Provider Note ____________________________________________  Time seen: Approximately 2pm I have reviewed the triage vital signs and the triage nursing note.  HISTORY  Chief Complaint Fall   Historian Patient's own history limited by dementia Son at bedside and ems report provide history  HPI Michelle Ballard is a 82 y.o. female with dementia has had multiple falls in the past several months. She went from living at home after a fall 2 memory care at [facility] house, and then sustained a fall several weeks ago and was found to have multiple pelvic fractures and discharged to [facility] acute care rehabilitation, from which she did regain some strength and was able to walk with a walker, but not really able to get up on her own, and was discharged for one day back to [facility] dementia unit. Today she had a fall that according to the son was told that she was at the dining room and try to stand up on her own and fell over.   Unclear whether or not she struck her head but she is complaining of pain at her pelvis in the right hip. No altered mental status to her baseline dementia.     Past Medical History:  Diagnosis Date  . Dementia   . Multiple pelvic fractures [facility])     Patient Active Problem List   Diagnosis Date Noted  . Acute encephalopathy 10/21/2015    Past Surgical History:  Procedure Laterality Date  . NO PAST SURGERIES      Prior to Admission medications   Medication Sig Start Date End Date Taking? Authorizing Provider  diclofenac sodium (VOLTAREN) 1 % GEL Apply 2 g topically 2 (two) times daily.   Yes Historical Provider, MD  donepezil (ARICEPT) 10 MG tablet Take 10 mg by mouth daily.    Yes Historical Provider, MD  LORazepam (ATIVAN) 1 MG tablet Take 1 mg by mouth 2 (two) times daily as needed for anxiety.   Yes Historical Provider, MD  memantine (NAMENDA) 10 MG tablet Take 10 mg by mouth 2 (two) times  daily.   Yes Historical Provider, MD  QUEtiapine (SEROQUEL) 25 MG tablet Take 25 mg by mouth at bedtime.   Yes Historical Provider, MD  sertraline (ZOLOFT) 100 MG tablet Take 100 mg by mouth daily.   Yes Historical Provider, MD  HYDROcodone-acetaminophen (NORCO/VICODIN) 5-325 MG tablet Take 0.5 tablets by mouth every 6 (six) hours as needed for moderate pain or severe pain. 10/23/15   Altamese Dilling, MD  LORazepam (ATIVAN) 0.5 MG tablet Take 0.5 mg by mouth 2 (two) times daily.    Historical Provider, MD    No Known Allergies  Family History  Problem Relation Age of Onset  . Lung cancer Mother   . Lung cancer Father   . Cirrhosis Brother     Social History Social History  Substance Use Topics  . Smoking status: Former Games developer  . Smokeless tobacco: Not on file  . Alcohol use No    Review of Systems  Constitutional: Negative for fever. Eyes:  Negative for visual changes. ENT: Negative for sore throat. Cardiovascular: Negative for chest pain. Respiratory: Negative for shortness of breath. Gastrointestinal: Negative for abdominal pain, vomiting and diarrhea. Genitourinary: Negative for dysuria. Musculoskeletal: Negative for back pain. Skin: Negative for rash. Neurological: Negative for headache. 10 point Review of Systems otherwise negative ____________________________________________   PHYSICAL EXAM:  VITAL SIGNS: ED Triage Vitals  Enc Vitals Group     BP  11/13/15 1306 (!) 147/59     Pulse Rate 11/13/15 1306 62     Resp 11/13/15 1306 17     Temp 11/13/15 1306 97.7 F (36.5 C)     Temp Source 11/13/15 1306 Oral     SpO2 11/13/15 1306 98 %     Weight 11/13/15 1306 145 lb (65.8 kg)     Height 11/13/15 1306  (1.626 m)     Head Circumference --      Peak Flow --      Pain Score 11/13/15 1307 8     Pain Loc --      Pain Edu? --      Excl. in GC? --      Constitutional: Alert and cooperative but disoriented. Well appearing and in no distress. HEENT    Head: Normocephalic and atraumatic.      Eyes: Conjunctivae are normal. PERRL. Normal extraocular movements.      Ears:         Nose: No congestion/rhinnorhea.   Mouth/Throat: Mucous membranes are moist.   Neck: No stridor. Cardiovascular/Chest: Normal rate, regular rhythm.  No murmurs, rubs, or gallops. Respiratory: Normal respiratory effort without tachypnea nor retractions. Breath sounds are clear and equal bilaterally. No wheezes/rales/rhonchi. Gastrointestinal: Soft. No distention, no guarding, no rebound. Nontender.    Genitourinary/rectal:Deferred Musculoskeletal: Pelvis stable, but some tenderness with range of motion of the right hip, not much pain at the left hip and patient is able to range of motion both hips on her own.  Neurologic: Speech. Cranial nerves II through X intact.  No slurred speech. No gross or focal neurologic deficits are appreciated. Skin:  Skin is warm, dry and intact. No rash noted. Psychiatric: No hallucinations.  ____________________________________________   EKG I, Governor Rooks, MD, the attending physician have personally viewed and interpreted all ECGs.  None ____________________________________________  LABS (pertinent positives/negatives)  Labs Reviewed  BASIC METABOLIC PANEL - Abnormal; Notable for the following:       Result Value   Potassium 3.4 (*)    Glucose, Bld 134 (*)    All other components within normal limits  CBC - Abnormal; Notable for the following:    RBC 5.78 (*)    Hemoglobin 11.3 (*)    MCV 61.0 (*)    MCH 19.5 (*)    RDW 17.2 (*)    All other components within normal limits  URINALYSIS COMPLETEWITH MICROSCOPIC ([facility] ONLY) - Abnormal; Notable for the following:    Color, Urine YELLOW (*)    APPearance CLEAR (*)    Squamous Epithelial / LPF 6-30 (*)    All other components within normal limits    ____________________________________________  RADIOLOGY All Xrays were viewed by me. Imaging interpreted by  Radiologist.  CT cervical spine and head noncontrast:IMPRESSION: 1. No acute intracranial abnormality and stable non contrast CT appearance of the brain. 2. No acute fracture or listhesis identified in the cervical spine. Ligamentous injury is not excluded.  Chest 1 view: IMPRESSION: No edema or consolidation. No pneumothorax. Aortic atherosclerosis.  Right Hip with pelvis:  IMPRESSION: Bones osteoporotic. Old healed fracture superior pubic ramus on the left. Age uncertain but probable chronic fracture medial left ischium in anatomic alignment. No acute fracture or dislocation. Symmetric narrowing both hip joints.  Pelvis CT:IMPRESSION: 1. Partially comminuted minimally displaced fractures of the left superior and inferior pubic rami (the former extending into the pubic symphysis) are associated with mild posttraumatic hematoma along the left pelvic sidewall and pelvic  floor. 2. Superimposed acute nondisplaced right superior pubic ramus fracture. 3. Superimposed subacute bilateral sacral insufficiency fractures (diagnosed by MRI in June). 4. Chronic right inferior pubic ramus fracture. 5. No acetabular or proximal femur fractures. 6. Large 9 cm left adnexal cystic mass again noted with consensus criteria recommending Pelvic Ultrasound for further characterization. 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 7. Calcified aortic atherosclerosis. __________________________________________  PROCEDURES  Procedure(s) performed: None  Critical Care performed: None  ____________________________________________   ED COURSE / ASSESSMENT AND PLAN  Pertinent labs & imaging results that were available during my care of the patient were reviewed by me and considered in my medical decision making (see chart for details).   Family is quite frustrated that the patient fell again, within 24 hours of going back to the memory care unit.    On exam, she seems to have some pain  in her right hip, and I will also CT her head and C-spine given her dementia and unclear whether or not she struck her head.  On her traumatic imaging evaluation, head and C-spine are negative. Pelvic fractures are old and healing, no new fractures or hip fracture.  Medical evaluation for possible intubating factors to why she fell although it sounds essentially mechanical, was negative. No evidence for urinary tract infection, electrolyte disturbance, or acute anemia.   We tried standing her up to see if she was able to bear weight and she was not able to even bear weight and was in a significant amount pain. Patient was given ibuprofen and fentanyl and plan for pelvis CT.  CT of the pelvis shows multiple new pelvic fractures including the left superior ramus, left inferior pubic ramus, right superior pubic ramus along with the chronic bilateral sacral fractures and old right inferior ramus fracture.   CONSULTATIONS:  Spoke with Orthopedic surgeon Dr. Binnie Rail OR nurse, will let him know about patient in ER, likely consult in the hospital as patient is stable.    Hospitalist for admission.   Patient / Family / Caregiver informed of clinical course, medical decision-making process, and agree with plan.   ___________________________________________   FINAL CLINICAL IMPRESSION(S) / ED DIAGNOSES   Final diagnoses:  Fall, initial encounter  Dementia, without behavioral disturbance  Multiple pelvic fractures, closed, initial encounter              Note: This dictation was prepared with Nurse, children's dictation. Any transcriptional errors that result from this process are unintentional    Governor Rooks, MD 11/13/15 7972    Governor Rooks, MD 11/13/15 [phone]

## 2015-11-13 NOTE — Consult Note (Signed)
ORTHOPAEDIC CONSULTATION  REQUESTING PHYSICIAN: Wyatt Haste, MD  Chief Complaint:   Left hip pain.  History of Present Illness: Michelle Ballard is a 82 y.o. female who recently moved into a memory care unit. While at lunch today, she apparently attempted to get up from her wheelchair and lost her balance and fell, landing on her left side. She was brought to the emergency room where x-rays and a CT scan of her pelvis were obtained. These studies demonstrated multiple essentially nondisplaced pelvic fractures, as described below. The patient denies any associated injury with a fall. She did not strike her head or lose consciousness. The patient has had several falls recently. In June, she probably fell and had bilateral sacral insufficiency fractures which were managed with a period of non-weightbearing followed by gradual progression of activities. According to family, she was doing well until she fell and broke some ribs. This necessitated another rehabilitation stay. Again, she appeared to be progressing well when she sustained this injury this morning.  Past Medical History:  Diagnosis Date  . Dementia   . Multiple pelvic fractures [facility])    Past Surgical History:  Procedure Laterality Date  . NO PAST SURGERIES     Social History   Social History  . Marital status: Widowed    Spouse name: N/A  . Number of children: N/A  . Years of education: N/A   Social History Main Topics  . Smoking status: Former Games developer  . Smokeless tobacco: Never Used  . Alcohol use No  . Drug use: No  . Sexual activity: Not Asked   Other Topics Concern  . None   Social History Narrative  . None   Family History  Problem Relation Age of Onset  . Lung cancer Mother   . Lung cancer Father   . Cirrhosis Brother    No Known Allergies Prior to Admission medications   Medication Sig Start Date End Date Taking? Authorizing Provider   diclofenac sodium (VOLTAREN) 1 % GEL Apply 2 g topically 2 (two) times daily.   Yes Historical Provider, MD  donepezil (ARICEPT) 10 MG tablet Take 10 mg by mouth daily.    Yes Historical Provider, MD  LORazepam (ATIVAN) 1 MG tablet Take 1 mg by mouth 2 (two) times daily as needed for anxiety.   Yes Historical Provider, MD  memantine (NAMENDA) 10 MG tablet Take 10 mg by mouth 2 (two) times daily.   Yes Historical Provider, MD  QUEtiapine (SEROQUEL) 25 MG tablet Take 25 mg by mouth at bedtime.   Yes Historical Provider, MD  sertraline (ZOLOFT) 100 MG tablet Take 100 mg by mouth daily.   Yes Historical Provider, MD  HYDROcodone-acetaminophen (NORCO/VICODIN) 5-325 MG tablet Take 0.5 tablets by mouth every 6 (six) hours as needed for moderate pain or severe pain. 10/23/15   Altamese Dilling, MD  LORazepam (ATIVAN) 0.5 MG tablet Take 0.5 mg by mouth 2 (two) times daily.    Historical Provider, MD   Dg Chest 1 View  Result Date: 11/13/2015 CLINICAL DATA:  Pain following fall EXAM: CHEST 1 VIEW COMPARISON:  None. FINDINGS: There is no edema or consolidation. The heart size and pulmonary vascularity are normal. No adenopathy. There is atherosclerotic calcification in the aorta. No pneumothorax. No fracture evident. There is lower thoracic levoscoliosis. IMPRESSION: No edema or consolidation. No pneumothorax. Aortic atherosclerosis. Electronically Signed   By: Bretta Bang III M.D.   On: 11/13/2015 14:39   Ct Head Wo Contrast  Result Date:  11/13/2015 CLINICAL DATA:  82 year old female with witnessed fall striking head. Hip and leg pain. Initial encounter. EXAM: CT HEAD WITHOUT CONTRAST CT CERVICAL SPINE WITHOUT CONTRAST TECHNIQUE: Multidetector CT imaging of the head and cervical spine was performed following the standard protocol without intravenous contrast. Multiplanar CT image reconstructions of the cervical spine were also generated. COMPARISON:  Head face and spine CT 10/21/2015. Cervical spine  MRI 10/21/2015. FINDINGS: CT HEAD FINDINGS Visualized paranasal sinuses and mastoids are stable and well pneumatized. No scalp hematoma. Visualized orbit soft tissues are within normal limits. Osteopenia. Calvarium stable and intact. Calcified atherosclerosis at the skull base. Stable cerebral volume. Stable patchy bilateral cerebral white matter hypodensity. No midline shift, mass effect, or evidence of intracranial mass lesion. No ventriculomegaly. No acute intracranial hemorrhage identified. No cortically based acute infarct identified. No suspicious intracranial vascular hyperdensity. CT CERVICAL SPINE FINDINGS Osteopenia. Reversal of cervical lordosis re- demonstrated with stable mild anterolisthesis at C2-C3, C4-C5 and C7-T1. Visualized skull base is intact. No atlanto-occipital dissociation. Stable bilateral posterior element alignment. Widespread advanced cervical disc and endplate degeneration. Widespread advanced left side cervical facet degeneration. No cervical spine fracture. Visible upper thoracic levels appear stable and intact. Stable visible lung apices with apical scarring. Calcified carotid atherosclerosis. Otherwise negative noncontrast neck soft tissues. IMPRESSION: 1. No acute intracranial abnormality and stable non contrast CT appearance of the brain. 2. No acute fracture or listhesis identified in the cervical spine. Ligamentous injury is not excluded. Electronically Signed   By: Odessa Fleming M.D.   On: 11/13/2015 14:53   Ct Cervical Spine Wo Contrast  Result Date: 11/13/2015 CLINICAL DATA:  82 year old female with witnessed fall striking head. Hip and leg pain. Initial encounter. EXAM: CT HEAD WITHOUT CONTRAST CT CERVICAL SPINE WITHOUT CONTRAST TECHNIQUE: Multidetector CT imaging of the head and cervical spine was performed following the standard protocol without intravenous contrast. Multiplanar CT image reconstructions of the cervical spine were also generated. COMPARISON:  Head face and  spine CT 10/21/2015. Cervical spine MRI 10/21/2015. FINDINGS: CT HEAD FINDINGS Visualized paranasal sinuses and mastoids are stable and well pneumatized. No scalp hematoma. Visualized orbit soft tissues are within normal limits. Osteopenia. Calvarium stable and intact. Calcified atherosclerosis at the skull base. Stable cerebral volume. Stable patchy bilateral cerebral white matter hypodensity. No midline shift, mass effect, or evidence of intracranial mass lesion. No ventriculomegaly. No acute intracranial hemorrhage identified. No cortically based acute infarct identified. No suspicious intracranial vascular hyperdensity. CT CERVICAL SPINE FINDINGS Osteopenia. Reversal of cervical lordosis re- demonstrated with stable mild anterolisthesis at C2-C3, C4-C5 and C7-T1. Visualized skull base is intact. No atlanto-occipital dissociation. Stable bilateral posterior element alignment. Widespread advanced cervical disc and endplate degeneration. Widespread advanced left side cervical facet degeneration. No cervical spine fracture. Visible upper thoracic levels appear stable and intact. Stable visible lung apices with apical scarring. Calcified carotid atherosclerosis. Otherwise negative noncontrast neck soft tissues. IMPRESSION: 1. No acute intracranial abnormality and stable non contrast CT appearance of the brain. 2. No acute fracture or listhesis identified in the cervical spine. Ligamentous injury is not excluded. Electronically Signed   By: Odessa Fleming M.D.   On: 11/13/2015 14:53   Ct Pelvis Wo Contrast  Result Date: 11/13/2015 CLINICAL DATA:  82 year old female with witnessed fall. Bilateral hip pain and pain radiating distally in the legs. Unable to weightbear. Initial encounter. EXAM: CT PELVIS WITHOUT CONTRAST TECHNIQUE: Multidetector CT imaging of the pelvis was performed following the standard protocol without intravenous contrast. COMPARISON:  Right hip series 1414  hours today. Pelvis MRI 09/17/2015. FINDINGS:  Bilateral sacral insufficiency fractures are present, as seen on the June MRI. There is associated sclerosis of the sacral ala. Elsewhere there is osteopenia. There is hematoma along the space of Retzius ventral to the urinary bladder and tracking to the left pelvic floor with associated intramuscular hematoma along the left pelvic sidewall. This is new since June and appears to be associated with a comminuted minimally displaced fracture of the left superior pubic ramus which extends into the symphysis (coronal image 28), and a more simple appearing oblique fracture of the left inferior pubic ramus (series 3, image 102). There was some pubic symphysis marrow edema in June. The left acetabulum and proximal femur appear intact. There is a superimposed subtle nondisplaced fracture of the right superior pubic ramus seen only on coronal image 36. This does not appear to extend into the symphysis. There is a chronic appearing minimally displaced right inferior pubic ramus fracture with sclerosis. The right acetabulum and proximal right femur appear intact. Mild mass effect on the urinary bladder. No pelvic free fluid. Decompressed distal large and small bowel loops. Aortoiliac calcified atherosclerosis noted. Large 9.5 cm cystic mass located in the superior left pelvis appears to be arising from the left adnexa (on series 10, image 63) and has simple fluid densitometry. This is stable since June. No lymphadenopathy. IMPRESSION: 1. Partially comminuted minimally displaced fractures of the left superior and inferior pubic rami (the former extending into the pubic symphysis) are associated with mild posttraumatic hematoma along the left pelvic sidewall and pelvic floor. 2. Superimposed acute nondisplaced right superior pubic ramus fracture. 3. Superimposed subacute bilateral sacral insufficiency fractures (diagnosed by MRI in June). 4. Chronic right inferior pubic ramus fracture. 5. No acetabular or proximal femur  fractures. 6. Large 9 cm left adnexal cystic mass again noted with consensus criteria recommending Pelvic Ultrasound for further characterization. 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 7.  Calcified aortic atherosclerosis. Electronically Signed   By: Odessa Fleming M.D.   On: 11/13/2015 18:40   Dg Hip Unilat W Or Wo Pelvis 2-3 Views Right  Result Date: 11/13/2015 CLINICAL DATA:  Pain following fall EXAM: DG HIP (WITH OR WITHOUT PELVIS) 2-3V RIGHT COMPARISON:  None. FINDINGS: Frontal pelvis as well as frontal and lateral right hip images were obtained. There is evidence of an old healed fracture of the left superior pubic ramus. There is a prior fracture of the medial left ischium, age uncertain but probably nonacute. There is no demonstrable acute fracture or dislocation. There is moderate symmetric narrowing of both hip joints. No erosive change. Bones are osteoporotic. IMPRESSION: Bones osteoporotic. Old healed fracture superior pubic ramus on the left. Age uncertain but probable chronic fracture medial left ischium in anatomic alignment. No acute fracture or dislocation. Symmetric narrowing both hip joints. Electronically Signed   By: Bretta Bang III M.D.   On: 11/13/2015 14:38    Positive ROS: All other systems have been reviewed and were otherwise negative with the exception of those mentioned in the HPI and as above.  Physical Exam: General:  Alert, no acute distress Psychiatric:  Patient is pleasantly demented with normal mood and affect   Cardiovascular:  No pedal edema Respiratory:  No wheezing, non-labored breathing GI:  Abdomen is soft and non-tender Skin:  No lesions in the area of chief complaint Neurologic:  Sensation intact distally Lymphatic:  No axillary or cervical lymphadenopathy  Orthopedic Exam:  Orthopedic examination is limited to the patient's pelvis  and lower extremities. Skin inspection is unremarkable. There is no swelling, erythema, ecchymosis, or abrasions  around either hip region. She is able to perform a straight leg raise without difficulty and the right, but has some discomfort attempting to perform a straight leg raise on the left, although she can do it. She has no pain to log rolling of either leg. She is neurovascularly intact to both lower extremities.  X-rays:  X-rays of the pelvis and left hip, as well as a CT scan of the pelvis, are available for review. The findings are as described above. These films demonstrate essentially nondisplaced superior and inferior pubic rami fractures on the left, a nondisplaced inferior pubic ramus fracture on the right, and subacute healing bilateral sacral insufficiency fractures.  Assessment: Multiple nondisplaced/minimally displaced pelvic fractures.  Plan: The treatment options are discussed with the patient and her family, who are at the bedside. The family has been through this recently as they followed the patient through her bilateral sacral insufficiency fractures, so they understand the treatment protocol. The patient will be admitted at this time for pain control and rehabilitation placement. She may be mobilized with physical therapy, weightbearing as tolerated with a walker and assistance.  Thank you for ask me to participate in the care of this pleasant unfortunate woman. I will be happy to follow her with you.   Maryagnes Amos, MD  Beeper #:  [phone]  11/13/2015 8:55 PM

## 2015-11-13 NOTE — ED Notes (Signed)
This RN called into patients room. Family is concerned patient was having a seizure. Upon entering the room, patient is shivering and speaking in full, coherent sentences. Patient mentation is at baseline. Patient denies being cold. Temp checked, patient remains afebrile. Warm blankets given. Family reassured. MD notified.

## 2015-11-13 NOTE — ED Notes (Signed)
Patient transported to CT 

## 2015-11-13 NOTE — ED Notes (Signed)
Pt transported to room 158. 

## 2015-11-13 NOTE — Discharge Instructions (Signed)
No new serious injury was found from the fall today. Exam, history, and laboratory studies are reassuring for no new medical condition.  Return to the emergency department for any worsening condition including any new confusion or altered mental status, weakness or numbness, or any other symptoms concerning to you.

## 2015-11-13 NOTE — ED Notes (Signed)
MD at bedside requesting to finish assessment before transporting patient.

## 2015-11-13 NOTE — ED Triage Notes (Signed)
Per [facility], patient comes from [facility]. Patient had a witnessed fall today. Staff lowered her to the ground. Patient c/o right and left hip pain and shoot pain down both of her legs. Hx of dementia. A&O x1 only to person. VSS.

## 2015-11-14 LAB — MRSA PCR SCREENING: MRSA BY PCR: NEGATIVE

## 2015-11-14 MED ORDER — POTASSIUM CHLORIDE CRYS ER 20 MEQ PO TBCR
40.0000 meq | EXTENDED_RELEASE_TABLET | Freq: Once | ORAL | Status: DC
  Filled 2015-11-14: qty 2

## 2015-11-14 MED ORDER — PANTOPRAZOLE SODIUM 40 MG PO TBEC
40.0000 mg | DELAYED_RELEASE_TABLET | Freq: Every day | ORAL | Status: DC
  Administered 2015-11-14 – 2015-11-16 (×3): 40 mg via ORAL
  Filled 2015-11-14 (×3): qty 1

## 2015-11-14 NOTE — Progress Notes (Signed)
[facility] - [place] at [facility]   PATIENT NAME: Michelle Ballard    MR#:  [id number]  DATE OF BIRTH:  09/29/1930  SUBJECTIVE:  CHIEF COMPLAINT:   Chief Complaint  Patient presents with  . Fall   The patient is a 82 year old Caucasian female with past medical history significant for history of dementia, multiple pelvic fractures who presents to the hospital with complaints of fall and pain, apparently falling out from her wheelchair, landing on the left side. On arrival to emergency room, she had x-rays done as well as CT scan of the pelvis, which revealed nondisplaced pelvic fractures. Patient admits of some mild abdominal pain, not able to elaborate   Review of Systems  Unable to perform ROS: Dementia    VITAL SIGNS: Blood pressure (!) 132/51, pulse 70, temperature 98 F (36.7 C), temperature source Oral, resp. rate 18, height 5\' 4"  (1.626 m), weight 65.8 kg (145 lb), SpO2 92 %.  PHYSICAL EXAMINATION:   GENERAL:  82 y.o.-year-old patient lying in the bed with no acute distress.  EYES: Pupils equal, round, reactive to light and accommodation. No scleral icterus. Extraocular muscles intact.  HEENT: Head atraumatic, normocephalic. Oropharynx and nasopharynx clear.  NECK:  Supple, no jugular venous distention. No thyroid enlargement, no tenderness.  LUNGS: Normal breath sounds bilaterally, no wheezing, rales,rhonchi or crepitation. No use of accessory muscles of respiration.  CARDIOVASCULAR: S1, S2 normal. No murmurs, rubs, or gallops.  ABDOMEN: Soft, nontender, nondistended. Bowel sounds present. No organomegaly or mass.  EXTREMITIES: No pedal edema, cyanosis, or clubbing.  NEUROLOGIC: Cranial nerves II through XII are intact. Muscle strength 5/5 in all extremities. Sensation intact. Gait not checked.  PSYCHIATRIC: The patient is alert , disoriented 3.  SKIN: No obvious rash, lesion, or ulcer.   ORDERS/RESULTS REVIEWED:   CBC  Recent Labs Lab  11/13/15 1321  WBC 10.7  HGB 11.3*  HCT 35.2  PLT 289  MCV 61.0*  MCH 19.5*  MCHC 32.0  RDW 17.2*   ------------------------------------------------------------------------------------------------------------------  Chemistries   Recent Labs Lab 11/13/15 1321  NA 141  K 3.4*  CL 108  CO2 25  GLUCOSE 134*  BUN 16  CREATININE 0.84  CALCIUM 8.9   ------------------------------------------------------------------------------------------------------------------ estimated creatinine clearance is 47.3 mL/min (by C-G formula based on SCr of 0.84 mg/dL). ------------------------------------------------------------------------------------------------------------------ No results for input(s): TSH, T4TOTAL, T3FREE, THYROIDAB in the last 72 hours.  Invalid input(s): FREET3  Cardiac Enzymes No results for input(s): CKMB, TROPONINI, MYOGLOBIN in the last 168 hours.  Invalid input(s): CK ------------------------------------------------------------------------------------------------------------------ Invalid input(s): POCBNP ---------------------------------------------------------------------------------------------------------------  RADIOLOGY: Dg Chest 1 View  Result Date: 11/13/2015 CLINICAL DATA:  Pain following fall EXAM: CHEST 1 VIEW COMPARISON:  None. FINDINGS: There is no edema or consolidation. The heart size and pulmonary vascularity are normal. No adenopathy. There is atherosclerotic calcification in the aorta. No pneumothorax. No fracture evident. There is lower thoracic levoscoliosis. IMPRESSION: No edema or consolidation. No pneumothorax. Aortic atherosclerosis. Electronically Signed   By: Bretta Bang III M.D.   On: 11/13/2015 14:39   Ct Head Wo Contrast  Result Date: 11/13/2015 CLINICAL DATA:  82 year old female with witnessed fall striking head. Hip and leg pain. Initial encounter. EXAM: CT HEAD WITHOUT CONTRAST CT CERVICAL SPINE WITHOUT CONTRAST TECHNIQUE:  Multidetector CT imaging of the head and cervical spine was performed following the standard protocol without intravenous contrast. Multiplanar CT image reconstructions of the cervical spine were also generated. COMPARISON:  Head face and spine CT 10/21/2015. Cervical spine MRI 10/21/2015.  FINDINGS: CT HEAD FINDINGS Visualized paranasal sinuses and mastoids are stable and well pneumatized. No scalp hematoma. Visualized orbit soft tissues are within normal limits. Osteopenia. Calvarium stable and intact. Calcified atherosclerosis at the skull base. Stable cerebral volume. Stable patchy bilateral cerebral white matter hypodensity. No midline shift, mass effect, or evidence of intracranial mass lesion. No ventriculomegaly. No acute intracranial hemorrhage identified. No cortically based acute infarct identified. No suspicious intracranial vascular hyperdensity. CT CERVICAL SPINE FINDINGS Osteopenia. Reversal of cervical lordosis re- demonstrated with stable mild anterolisthesis at C2-C3, C4-C5 and C7-T1. Visualized skull base is intact. No atlanto-occipital dissociation. Stable bilateral posterior element alignment. Widespread advanced cervical disc and endplate degeneration. Widespread advanced left side cervical facet degeneration. No cervical spine fracture. Visible upper thoracic levels appear stable and intact. Stable visible lung apices with apical scarring. Calcified carotid atherosclerosis. Otherwise negative noncontrast neck soft tissues. IMPRESSION: 1. No acute intracranial abnormality and stable non contrast CT appearance of the brain. 2. No acute fracture or listhesis identified in the cervical spine. Ligamentous injury is not excluded. Electronically Signed   By: Odessa Fleming M.D.   On: 11/13/2015 14:53   Ct Cervical Spine Wo Contrast  Result Date: 11/13/2015 CLINICAL DATA:  82 year old female with witnessed fall striking head. Hip and leg pain. Initial encounter. EXAM: CT HEAD WITHOUT CONTRAST CT CERVICAL  SPINE WITHOUT CONTRAST TECHNIQUE: Multidetector CT imaging of the head and cervical spine was performed following the standard protocol without intravenous contrast. Multiplanar CT image reconstructions of the cervical spine were also generated. COMPARISON:  Head face and spine CT 10/21/2015. Cervical spine MRI 10/21/2015. FINDINGS: CT HEAD FINDINGS Visualized paranasal sinuses and mastoids are stable and well pneumatized. No scalp hematoma. Visualized orbit soft tissues are within normal limits. Osteopenia. Calvarium stable and intact. Calcified atherosclerosis at the skull base. Stable cerebral volume. Stable patchy bilateral cerebral white matter hypodensity. No midline shift, mass effect, or evidence of intracranial mass lesion. No ventriculomegaly. No acute intracranial hemorrhage identified. No cortically based acute infarct identified. No suspicious intracranial vascular hyperdensity. CT CERVICAL SPINE FINDINGS Osteopenia. Reversal of cervical lordosis re- demonstrated with stable mild anterolisthesis at C2-C3, C4-C5 and C7-T1. Visualized skull base is intact. No atlanto-occipital dissociation. Stable bilateral posterior element alignment. Widespread advanced cervical disc and endplate degeneration. Widespread advanced left side cervical facet degeneration. No cervical spine fracture. Visible upper thoracic levels appear stable and intact. Stable visible lung apices with apical scarring. Calcified carotid atherosclerosis. Otherwise negative noncontrast neck soft tissues. IMPRESSION: 1. No acute intracranial abnormality and stable non contrast CT appearance of the brain. 2. No acute fracture or listhesis identified in the cervical spine. Ligamentous injury is not excluded. Electronically Signed   By: Odessa Fleming M.D.   On: 11/13/2015 14:53   Ct Pelvis Wo Contrast  Result Date: 11/13/2015 CLINICAL DATA:  82 year old female with witnessed fall. Bilateral hip pain and pain radiating distally in the legs. Unable to  weightbear. Initial encounter. EXAM: CT PELVIS WITHOUT CONTRAST TECHNIQUE: Multidetector CT imaging of the pelvis was performed following the standard protocol without intravenous contrast. COMPARISON:  Right hip series 1414 hours today. Pelvis MRI 09/17/2015. FINDINGS: Bilateral sacral insufficiency fractures are present, as seen on the June MRI. There is associated sclerosis of the sacral ala. Elsewhere there is osteopenia. There is hematoma along the space of Retzius ventral to the urinary bladder and tracking to the left pelvic floor with associated intramuscular hematoma along the left pelvic sidewall. This is new since June and appears to be associated  with a comminuted minimally displaced fracture of the left superior pubic ramus which extends into the symphysis (coronal image 28), and a more simple appearing oblique fracture of the left inferior pubic ramus (series 3, image 102). There was some pubic symphysis marrow edema in June. The left acetabulum and proximal femur appear intact. There is a superimposed subtle nondisplaced fracture of the right superior pubic ramus seen only on coronal image 36. This does not appear to extend into the symphysis. There is a chronic appearing minimally displaced right inferior pubic ramus fracture with sclerosis. The right acetabulum and proximal right femur appear intact. Mild mass effect on the urinary bladder. No pelvic free fluid. Decompressed distal large and small bowel loops. Aortoiliac calcified atherosclerosis noted. Large 9.5 cm cystic mass located in the superior left pelvis appears to be arising from the left adnexa (on series 10, image 63) and has simple fluid densitometry. This is stable since June. No lymphadenopathy. IMPRESSION: 1. Partially comminuted minimally displaced fractures of the left superior and inferior pubic rami (the former extending into the pubic symphysis) are associated with mild posttraumatic hematoma along the left pelvic sidewall and  pelvic floor. 2. Superimposed acute nondisplaced right superior pubic ramus fracture. 3. Superimposed subacute bilateral sacral insufficiency fractures (diagnosed by MRI in June). 4. Chronic right inferior pubic ramus fracture. 5. No acetabular or proximal femur fractures. 6. Large 9 cm left adnexal cystic mass again noted with consensus criteria recommending Pelvic Ultrasound for further characterization. 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 7.  Calcified aortic atherosclerosis. Electronically Signed   By: Odessa Fleming M.D.   On: 11/13/2015 18:40   Dg Hip Unilat W Or Wo Pelvis 2-3 Views Right  Result Date: 11/13/2015 CLINICAL DATA:  Pain following fall EXAM: DG HIP (WITH OR WITHOUT PELVIS) 2-3V RIGHT COMPARISON:  None. FINDINGS: Frontal pelvis as well as frontal and lateral right hip images were obtained. There is evidence of an old healed fracture of the left superior pubic ramus. There is a prior fracture of the medial left ischium, age uncertain but probably nonacute. There is no demonstrable acute fracture or dislocation. There is moderate symmetric narrowing of both hip joints. No erosive change. Bones are osteoporotic. IMPRESSION: Bones osteoporotic. Old healed fracture superior pubic ramus on the left. Age uncertain but probable chronic fracture medial left ischium in anatomic alignment. No acute fracture or dislocation. Symmetric narrowing both hip joints. Electronically Signed   By: Bretta Bang III M.D.   On: 11/13/2015 14:38    EKG:  Orders placed or performed during the hospital encounter of 11/13/15  . ED EKG  . ED EKG  . EKG 12-Lead  . EKG 12-Lead    ASSESSMENT AND PLAN:  Active Problems:   Pelvic fracture, closed, initial encounter #1 pelvic fracture, closed. Initial encounter. Continue pain management, physical therapy. Patient was seen by orthopedist surgeon, appreciate recommendations #2. Hypokalemia, supplement orally, follow in the morning #3 anemia, seems to be  stable, following #4. Dementia without behavioral disturbance, supportive therapy, skilled nursing facility placement for rehabilitation Management plans discussed with the patient, family and they are in agreement.   DRUG ALLERGIES: No Known Allergies  CODE STATUS:     Code Status Orders        Start     Ordered   11/13/15 1936  Full code  Continuous     11/13/15 1935    Code Status History    Date Active Date Inactive Code Status Order ID Comments User  Context   10/21/2015 11:57 AM 10/23/2015  4:27 PM Full Code [id number]  Alford Highland, MD ED    Advance Directive Documentation   Flowsheet Row Most Recent Value  Type of Advance Directive  Healthcare Power of Attorney  Pre-existing out of facility DNR order (yellow form or pink MOST form)  No data  "MOST" Form in Place?  No data      TOTAL TIME TAKING CARE OF THIS PATIENT: 35 minutes.    Katharina Caper M.D on 11/14/2015 at 1:04 PM  Between 7am to 6pm - Pager - [phone]  After 6pm go to [email or web address] - password [email or web address] [facility]  [facility]  Office  [phone]  CC: Primary care physician; Barbette Reichmann, MD

## 2015-11-14 NOTE — Progress Notes (Signed)
  Subjective:   Nondisplaced superior and inferior pubic rami fractures on the left, nondisplaced inferior pubic ramus fracture on the right and subacute healing bilateral sacral insufficiency fractures, DAY 1 Patient reports pain as 0 out of 10 while laying in bed this morning, jumps up to an 8 with any attempted motion..  Patient is well, and has had no acute complaints or problems Plan is to go Skilled nursing facility after hospital stay. Negative for chest pain and shortness of breath Fever: no Gastrointestinal:negative for nausea and vomiting  Objective: Vital signs in last 24 hours: Temp:  [97.7 F (36.5 C)-98.5 F (36.9 C)] 98.5 F (36.9 C) (08/05 0328) Pulse Rate:  [59-79] 62 (08/05 0330) Resp:  [12-28] 18 (08/04 2115) BP: (111-164)/(46-78) 111/50 (08/05 0330) SpO2:  [94 %-99 %] 94 % (08/05 0330) Weight:  [65.8 kg (145 lb)] 65.8 kg (145 lb) (08/04 1306)  Intake/Output from previous day: No intake or output data in the 24 hours ending 11/14/15 0806  Intake/Output this shift: No intake/output data recorded.  Labs:  Recent Labs  11/13/15 1321  HGB 11.3*    Recent Labs  11/13/15 1321  WBC 10.7  RBC 5.78*  HCT 35.2  PLT 289    Recent Labs  11/13/15 1321  NA 141  K 3.4*  CL 108  CO2 25  BUN 16  CREATININE 0.84  GLUCOSE 134*  CALCIUM 8.9   No results for input(s): LABPT, INR in the last 72 hours.   EXAM General - Patient is Alert and Confused Extremity - ABD soft Neurovascular intact Sensation intact distally Intact pulses distally  Mild pain bilaterally with log-rolling maneuvers Increased pain with passive hip flexion bilaterally Motor Function - intact, moving foot and toes well on exam. Negative Homan's bilaterally  Past Medical History:  Diagnosis Date  . Dementia   . Multiple pelvic fractures (HCC)     Assessment/Plan:    Active Problems:   Pelvic fracture, closed, initial encounter  Estimated body mass index is 24.89 kg/m as  calculated from the following:   Height as of this encounter: 5\' 4"  (1.626 m).   Weight as of this encounter: 65.8 kg (145 lb). Advance diet Up with therapy   Up with therapy today, with a walker and assistance. History difficult to obtain, but denies any SOB or N/V.  DVT Prophylaxis - Lovenox, Foot Pumps and TED hose Weight-Bearing as tolerated to both legs  J. Horris Latino, PA-C [facility] 11/14/2015, 8:06 AM

## 2015-11-14 NOTE — Care Management Important Message (Signed)
Important Message  Patient Details  Name: GENORA CLAYCAMP MRN: [id number] Date of Birth: 05-22-1930   Medicare Important Message Given:  Yes    Advik Weatherspoon A, RN 11/14/2015, 2:17 PM

## 2015-11-14 NOTE — Progress Notes (Signed)
Patient had a good morning, but this afternoon patient became agitated and trying to jump OOB. Mats on floor and bed alarm in place.  When room by nurses' station became available, moved patient to room 144. Took pain meds x2, sometimes more difficult to get patient to take meds. Incont of urine. Alert to self only. Sitter in place for safety.

## 2015-11-14 NOTE — Progress Notes (Signed)
Pt very confused. No complaints of pain at rest. Incontinent  of urine. Able to sleep without difficulty during the night.

## 2015-11-14 NOTE — Evaluation (Signed)
Physical Therapy Evaluation Patient Details Name: Michelle Ballard MRN: [id number] DOB: [birth year] Today's Date: 11/14/2015   History of Present Illness  Pt here with fall and pelvic fx ~3 weeks ago, now here with another fall and pubic rami fx.  Pt with severe dementia, difficult to convince her to participate.  Clinical Impression  Difficult PT assessment secondary to pt having considerable confusion and some agitation.  With careful manueving and cuing/encouragement PT was able to convince her to try participating but pt has a lot of pain with any movement, struggles with do any weight shifting/standing and ultimately is too confused to follow most instructions appropriately.     Follow Up Recommendations SNF    Equipment Recommendations       Recommendations for Other Services       Precautions / Restrictions Precautions Precautions: Fall Precaution Comments: Hx of falls, pt is impulsive and confused. pelvic Fx s/p several weeks, acute rib Fx possible Restrictions Weight Bearing Restrictions: No      Mobility  Bed Mobility Overal bed mobility: Needs Assistance Bed Mobility: Supine to Sit;Sit to Supine     Supine to sit: Mod assist;Max assist Sit to supine: Max assist   General bed mobility comments: Pt calling out in pain during transition, only minimally able to assist  Transfers Overall transfer level: Needs assistance Equipment used: Rolling walker (2 wheeled) Transfers: Sit to/from Stand Sit to Stand: Mod assist;Max assist         General transfer comment: Pt confused and hesitant to assist standing, but once PT initiated movement she did show some willingness to assist  Ambulation/Gait             General Gait Details: Pt was unable to ambulate, she was able to take a few small shuffle steps to the side but ultimately is very confused, pain limited and hesitant to do much  Stairs            Wheelchair Mobility    Modified Rankin (Stroke  Patients Only)       Balance Overall balance assessment: Needs assistance   Sitting balance-Leahy Scale: Poor Sitting balance - Comments: pt screaming out in pain, struggles to maintain sitting balance                                     Pertinent Vitals/Pain Pain Assessment:  (unable to rate, c/o severe pain with all movement)    Home Living Family/patient expects to be discharged to:: Skilled nursing facility                 Additional Comments: Pt on memory care unit    Prior Function Level of Independence: Needs assistance         Comments: difficult to really assess how much she was doing, pt very confused     Hand Dominance        Extremity/Trunk Assessment   Upper Extremity Assessment: Difficult to assess due to impaired cognition;Generalized weakness           Lower Extremity Assessment: Difficult to assess due to impaired cognition;Generalized weakness (pt tolerates little PROM with b/l LEs)         Communication   Communication:  (Pt confused and agitated)  Cognition Arousal/Alertness:  (baseline dementia) Behavior During Therapy: Agitated;Restless Overall Cognitive Status: History of cognitive impairments - at baseline       Memory: Decreased short-term memory  General Comments      Exercises        Assessment/Plan    PT Assessment Patient needs continued PT services (per ability to participate)  PT Diagnosis Difficulty walking;Generalized weakness   PT Problem List Decreased strength;Decreased activity tolerance;Decreased balance;Pain;Decreased safety awareness;Decreased knowledge of precautions;Decreased knowledge of use of DME;Decreased cognition;Decreased coordination;Decreased mobility;Decreased range of motion  PT Treatment Interventions DME instruction;Gait training;Stair training;Functional mobility training;Therapeutic activities;Therapeutic exercise;Balance training   PT Goals (Current  goals can be found in the Care Plan section) Acute Rehab PT Goals Patient Stated Goal: unable to state PT Goal Formulation: Patient unable to participate in goal setting Time For Goal Achievement: 11/28/15 Potential to Achieve Goals: Fair    Frequency Min 2X/week   Barriers to discharge        Co-evaluation               End of Session Equipment Utilized During Treatment: Gait belt Activity Tolerance: Treatment limited secondary to agitation;Patient limited by pain Patient left: with bed alarm set;with call bell/phone within reach Nurse Communication: Mobility status         Time: 8786-7672 PT Time Calculation (min) (ACUTE ONLY): 18 min   Charges:   PT Evaluation $PT Eval Low Complexity: 1 Procedure     PT G CodesMalachi Pro, DPT 11/14/2015, 12:45 PM

## 2015-11-15 LAB — CBC
HCT: 31.9 % — ABNORMAL LOW (ref 35.0–47.0)
Hemoglobin: 10 g/dL — ABNORMAL LOW (ref 12.0–16.0)
MCH: 19.2 pg — AB (ref 26.0–34.0)
MCHC: 31.4 g/dL — AB (ref 32.0–36.0)
MCV: 61.2 fL — ABNORMAL LOW (ref 80.0–100.0)
Platelets: 241 10*3/uL (ref 150–440)
RBC: 5.21 MIL/uL — ABNORMAL HIGH (ref 3.80–5.20)
RDW: 16.8 % — ABNORMAL HIGH (ref 11.5–14.5)
WBC: 8.1 10*3/uL (ref 3.6–11.0)

## 2015-11-15 LAB — BASIC METABOLIC PANEL
Anion gap: 5 (ref 5–15)
BUN: 15 mg/dL (ref 6–20)
CALCIUM: 8.2 mg/dL — AB (ref 8.9–10.3)
CO2: 26 mmol/L (ref 22–32)
CREATININE: 0.72 mg/dL (ref 0.44–1.00)
Chloride: 110 mmol/L (ref 101–111)
GFR calc Af Amer: >60 mL/min (ref >60)
GLUCOSE: 88 mg/dL (ref 65–99)
Potassium: 3.3 mmol/L — ABNORMAL LOW (ref 3.5–5.1)
Sodium: 141 mmol/L (ref 135–145)

## 2015-11-15 LAB — MAGNESIUM: Magnesium: 2 mg/dL (ref 1.7–2.4)

## 2015-11-15 MED ORDER — POTASSIUM CHLORIDE CRYS ER 20 MEQ PO TBCR
40.0000 meq | EXTENDED_RELEASE_TABLET | Freq: Four times a day (QID) | ORAL | Status: AC
  Administered 2015-11-15: 40 meq via ORAL
  Filled 2015-11-15: qty 2

## 2015-11-15 NOTE — Plan of Care (Signed)
Problem: Safety: Goal: Ability to remain free from injury will improve Outcome: Not Progressing D/T history of dementia.

## 2015-11-15 NOTE — NC FL2 (Signed)
[place] MEDICAID FL2 LEVEL OF CARE SCREENING TOOL     IDENTIFICATION  Patient Name: Michelle Ballard Birthdate: Sep 22, 1930 Sex: female Admission Date (Current Location): 11/13/2015  [place] and [place] Number:  Chiropodist and Address:  [facility], [address] [place], [place] [address]      Provider Number: [id number]  Attending Physician Name and Address:  Katharina Caper, MD  Relative Name and Phone Number:       Current Level of Care: Hospital Recommended Level of Care: Skilled Nursing Facility Prior Approval Number:    Date Approved/Denied:   PASRR Number: [id number]  Discharge Plan: SNF    Current Diagnoses: Patient Active Problem List   Diagnosis Date Noted  . Pelvic fracture, closed, initial encounter 11/13/2015    Orientation RESPIRATION BLADDER Height & Weight     Self  Normal Continent Weight: 145 lb (65.8 kg) Height:   (162.6 cm)  BEHAVIORAL SYMPTOMS/MOOD NEUROLOGICAL BOWEL NUTRITION STATUS      Continent    AMBULATORY STATUS COMMUNICATION OF NEEDS Skin   Extensive Assist Verbally Normal                       Personal Care Assistance Level of Assistance  Bathing, Feeding, Dressing, Total care Bathing Assistance: Maximum assistance Feeding assistance: Independent Dressing Assistance: Maximum assistance Total Care Assistance: Maximum assistance   Functional Limitations Info    Sight Info: Adequate Hearing Info: Adequate Speech Info: Adequate    SPECIAL CARE FACTORS FREQUENCY        PT Frequency: 3X a day 5X a week              Contractures      Additional Factors Info                  Current Medications (11/15/2015):  This is the current hospital active medication list Current Facility-Administered Medications  Medication Dose Route Frequency Provider Last Rate Last Dose  . acetaminophen (TYLENOL) tablet 650 mg  650 mg Oral Q6H PRN Wyatt Haste, MD       Or  . acetaminophen  (TYLENOL) suppository 650 mg  650 mg Rectal Q6H PRN Wyatt Haste, MD      . diclofenac sodium (VOLTAREN) 1 % transdermal gel 2 g  2 g Topical BID Wyatt Haste, MD   2 g at 11/15/15 0912  . docusate sodium (COLACE) capsule 100 mg  100 mg Oral BID Wyatt Haste, MD   100 mg at 11/15/15 0910  . donepezil (ARICEPT) tablet 10 mg  10 mg Oral Daily Wyatt Haste, MD   10 mg at 11/15/15 0912  . enoxaparin (LOVENOX) injection 40 mg  40 mg Subcutaneous Q24H Wyatt Haste, MD   40 mg at 11/14/15 1945  . memantine (NAMENDA) tablet 10 mg  10 mg Oral BID Wyatt Haste, MD   10 mg at 11/15/15 0910  . morphine 2 MG/ML injection 2 mg  2 mg Intravenous Q4H PRN Wyatt Haste, MD      . ondansetron [facility]) tablet 4 mg  4 mg Oral Q6H PRN Wyatt Haste, MD       Or  . ondansetron [facility]) injection 4 mg  4 mg Intravenous Q6H PRN Wyatt Haste, MD      . oxyCODONE (Oxy IR/ROXICODONE) immediate release tablet 5 mg  5 mg Oral Q4H PRN Wyatt Haste, MD   5 mg  at 11/15/15 0640  . pantoprazole (PROTONIX) EC tablet 40 mg  40 mg Oral Daily Katharina Caperima Vaickute, MD   40 mg at 11/15/15 0912  . polyethylene glycol (MIRALAX / GLYCOLAX) packet 17 g  17 g Oral Daily PRN Wyatt Hasteavid K Hower, MD      . potassium chloride SA (K-DUR,KLOR-CON) CR tablet 40 mEq  40 mEq Oral Once Katharina Caperima [place]ick[place], MD      . potassium chloride SA (K-DUR,KLOR-CON) CR tablet 40 mEq  40 mEq Oral Q6H Katharina Caperima Vaickute, MD   40 mEq at 11/15/15 0911  . QUEtiapine (SEROQUEL) tablet 25 mg  25 mg Oral QHS Wyatt Hasteavid K Hower, MD   25 mg at 11/14/15 2042  . sertraline (ZOLOFT) tablet 100 mg  100 mg Oral Daily Wyatt Hasteavid K Hower, MD   100 mg at 11/15/15 0911     Discharge Medications: Please see discharge summary for a list of discharge medications.  Relevant Imaging Results:  Relevant Lab Results:   Additional Information    Judi CongKaren M Coy [place]och[place], LCSW

## 2015-11-15 NOTE — Care Management Note (Addendum)
Case Management Note  Patient Details  Name: Michelle Ballard MRN: [id number] Date of Birth: Jun 04, 1930  Subjective/Objective:       Call to weekend SW after Dr Joice LoftsPoggi inquired about placement progress. Anticipate discharge to SNF tomorrow. [name] is from [facility].              Action/Plan:   Expected Discharge Date:  11/16/15               Expected Discharge Plan:     In-House Referral:     Discharge planning Services     Post Acute Care Choice:    Choice offered to:     DME Arranged:    DME Agency:     HH Arranged:    HH Agency:     Status of Service:     If discussed at [employer] Meetings, dates discussed:    Additional Comments:  Annye Forrey A, RN 11/15/2015, 2:51 PM

## 2015-11-15 NOTE — Progress Notes (Signed)
[facility] [place] at [facility]   PATIENT NAME: Michelle Ballard    MR#:  [id number]  DATE OF BIRTH:  July 27, 1930  SUBJECTIVE:  CHIEF COMPLAINT:   Chief Complaint  Patient presents with  . Fall   The patient is a 82 year old Caucasian female with past medical history significant for history of dementia, multiple pelvic fractures who presents to the hospital with complaints of fall and pain, apparently falling out from her wheelchair, landing on the left side. On arrival to emergency room, she had x-rays done as well as CT scan of the pelvis, which revealed nondisplaced pelvic fractures. Patient admits of some mild abdominal pain, not able to elaborate No change today, denies any pain at present  Review of Systems  Unable to perform ROS: Dementia    VITAL SIGNS: Blood pressure (!) 122/51, pulse 71, temperature 98.3 F (36.8 C), temperature source Oral, resp. rate 16, height 5\' 4"  (1.626 m), weight 65.8 kg (145 lb), SpO2 97 %.  PHYSICAL EXAMINATION:   GENERAL:  82 y.o.-year-old patient lying in the bed with no acute distress.  EYES: Pupils equal, round, reactive to light and accommodation. No scleral icterus. Extraocular muscles intact.  HEENT: Head atraumatic, normocephalic. Oropharynx and nasopharynx clear.  NECK:  Supple, no jugular venous distention. No thyroid enlargement, no tenderness.  LUNGS: Normal breath sounds bilaterally, no wheezing, rales,rhonchi or crepitation. No use of accessory muscles of respiration.  CARDIOVASCULAR: S1, S2 normal. No murmurs, rubs, or gallops.  ABDOMEN: Soft, nontender, nondistended. Bowel sounds present. No organomegaly or mass.  EXTREMITIES: No pedal edema, cyanosis, or clubbing.  NEUROLOGIC: Cranial nerves II through XII are intact. Muscle strength 5/5 in all extremities. Sensation intact. Gait not checked.  PSYCHIATRIC: The patient is alert , disoriented 3.  SKIN: No obvious rash, lesion, or ulcer.   ORDERS/RESULTS  REVIEWED:   CBC  Recent Labs Lab 11/13/15 1321 11/15/15 0435  WBC 10.7 8.1  HGB 11.3* 10.0*  HCT 35.2 31.9*  PLT 289 241  MCV 61.0* 61.2*  MCH 19.5* 19.2*  MCHC 32.0 31.4*  RDW 17.2* 16.8*   ------------------------------------------------------------------------------------------------------------------  Chemistries   Recent Labs Lab 11/13/15 1321 11/15/15 0435  NA 141 141  K 3.4* 3.3*  CL 108 110  CO2 25 26  GLUCOSE 134* 88  BUN 16 15  CREATININE 0.84 0.72  CALCIUM 8.9 8.2*  MG  --  2.0   ------------------------------------------------------------------------------------------------------------------ estimated creatinine clearance is 49.7 mL/min (by C-G formula based on SCr of 0.8 mg/dL). ------------------------------------------------------------------------------------------------------------------ No results for input(s): TSH, T4TOTAL, T3FREE, THYROIDAB in the last 72 hours.  Invalid input(s): FREET3  Cardiac Enzymes No results for input(s): CKMB, TROPONINI, MYOGLOBIN in the last 168 hours.  Invalid input(s): CK ------------------------------------------------------------------------------------------------------------------ Invalid input(s): POCBNP ---------------------------------------------------------------------------------------------------------------  RADIOLOGY: Dg Chest 1 View  Result Date: 11/13/2015 CLINICAL DATA:  Pain following fall EXAM: CHEST 1 VIEW COMPARISON:  None. FINDINGS: There is no edema or consolidation. The heart size and pulmonary vascularity are normal. No adenopathy. There is atherosclerotic calcification in the aorta. No pneumothorax. No fracture evident. There is lower thoracic levoscoliosis. IMPRESSION: No edema or consolidation. No pneumothorax. Aortic atherosclerosis. Electronically Signed   By: Bretta Bang III M.D.   On: 11/13/2015 14:39   Ct Head Wo Contrast  Result Date: 11/13/2015 CLINICAL DATA:  82 year old  female with witnessed fall striking head. Hip and leg pain. Initial encounter. EXAM: CT HEAD WITHOUT CONTRAST CT CERVICAL SPINE WITHOUT CONTRAST TECHNIQUE: Multidetector CT imaging of the head and cervical  spine was performed following the standard protocol without intravenous contrast. Multiplanar CT image reconstructions of the cervical spine were also generated. COMPARISON:  Head face and spine CT 10/21/2015. Cervical spine MRI 10/21/2015. FINDINGS: CT HEAD FINDINGS Visualized paranasal sinuses and mastoids are stable and well pneumatized. No scalp hematoma. Visualized orbit soft tissues are within normal limits. Osteopenia. Calvarium stable and intact. Calcified atherosclerosis at the skull base. Stable cerebral volume. Stable patchy bilateral cerebral white matter hypodensity. No midline shift, mass effect, or evidence of intracranial mass lesion. No ventriculomegaly. No acute intracranial hemorrhage identified. No cortically based acute infarct identified. No suspicious intracranial vascular hyperdensity. CT CERVICAL SPINE FINDINGS Osteopenia. Reversal of cervical lordosis re- demonstrated with stable mild anterolisthesis at C2-C3, C4-C5 and C7-T1. Visualized skull base is intact. No atlanto-occipital dissociation. Stable bilateral posterior element alignment. Widespread advanced cervical disc and endplate degeneration. Widespread advanced left side cervical facet degeneration. No cervical spine fracture. Visible upper thoracic levels appear stable and intact. Stable visible lung apices with apical scarring. Calcified carotid atherosclerosis. Otherwise negative noncontrast neck soft tissues. IMPRESSION: 1. No acute intracranial abnormality and stable non contrast CT appearance of the brain. 2. No acute fracture or listhesis identified in the cervical spine. Ligamentous injury is not excluded. Electronically Signed   By: Odessa Fleming M.D.   On: 11/13/2015 14:53   Ct Cervical Spine Wo Contrast  Result Date:  11/13/2015 CLINICAL DATA:  82 year old female with witnessed fall striking head. Hip and leg pain. Initial encounter. EXAM: CT HEAD WITHOUT CONTRAST CT CERVICAL SPINE WITHOUT CONTRAST TECHNIQUE: Multidetector CT imaging of the head and cervical spine was performed following the standard protocol without intravenous contrast. Multiplanar CT image reconstructions of the cervical spine were also generated. COMPARISON:  Head face and spine CT 10/21/2015. Cervical spine MRI 10/21/2015. FINDINGS: CT HEAD FINDINGS Visualized paranasal sinuses and mastoids are stable and well pneumatized. No scalp hematoma. Visualized orbit soft tissues are within normal limits. Osteopenia. Calvarium stable and intact. Calcified atherosclerosis at the skull base. Stable cerebral volume. Stable patchy bilateral cerebral white matter hypodensity. No midline shift, mass effect, or evidence of intracranial mass lesion. No ventriculomegaly. No acute intracranial hemorrhage identified. No cortically based acute infarct identified. No suspicious intracranial vascular hyperdensity. CT CERVICAL SPINE FINDINGS Osteopenia. Reversal of cervical lordosis re- demonstrated with stable mild anterolisthesis at C2-C3, C4-C5 and C7-T1. Visualized skull base is intact. No atlanto-occipital dissociation. Stable bilateral posterior element alignment. Widespread advanced cervical disc and endplate degeneration. Widespread advanced left side cervical facet degeneration. No cervical spine fracture. Visible upper thoracic levels appear stable and intact. Stable visible lung apices with apical scarring. Calcified carotid atherosclerosis. Otherwise negative noncontrast neck soft tissues. IMPRESSION: 1. No acute intracranial abnormality and stable non contrast CT appearance of the brain. 2. No acute fracture or listhesis identified in the cervical spine. Ligamentous injury is not excluded. Electronically Signed   By: Odessa Fleming M.D.   On: 11/13/2015 14:53   Ct Pelvis Wo  Contrast  Result Date: 11/13/2015 CLINICAL DATA:  82 year old female with witnessed fall. Bilateral hip pain and pain radiating distally in the legs. Unable to weightbear. Initial encounter. EXAM: CT PELVIS WITHOUT CONTRAST TECHNIQUE: Multidetector CT imaging of the pelvis was performed following the standard protocol without intravenous contrast. COMPARISON:  Right hip series 1414 hours today. Pelvis MRI 09/17/2015. FINDINGS: Bilateral sacral insufficiency fractures are present, as seen on the June MRI. There is associated sclerosis of the sacral ala. Elsewhere there is osteopenia. There is hematoma along the space  of Retzius ventral to the urinary bladder and tracking to the left pelvic floor with associated intramuscular hematoma along the left pelvic sidewall. This is new since June and appears to be associated with a comminuted minimally displaced fracture of the left superior pubic ramus which extends into the symphysis (coronal image 28), and a more simple appearing oblique fracture of the left inferior pubic ramus (series 3, image 102). There was some pubic symphysis marrow edema in June. The left acetabulum and proximal femur appear intact. There is a superimposed subtle nondisplaced fracture of the right superior pubic ramus seen only on coronal image 36. This does not appear to extend into the symphysis. There is a chronic appearing minimally displaced right inferior pubic ramus fracture with sclerosis. The right acetabulum and proximal right femur appear intact. Mild mass effect on the urinary bladder. No pelvic free fluid. Decompressed distal large and small bowel loops. Aortoiliac calcified atherosclerosis noted. Large 9.5 cm cystic mass located in the superior left pelvis appears to be arising from the left adnexa (on series 10, image 63) and has simple fluid densitometry. This is stable since June. No lymphadenopathy. IMPRESSION: 1. Partially comminuted minimally displaced fractures of the left  superior and inferior pubic rami (the former extending into the pubic symphysis) are associated with mild posttraumatic hematoma along the left pelvic sidewall and pelvic floor. 2. Superimposed acute nondisplaced right superior pubic ramus fracture. 3. Superimposed subacute bilateral sacral insufficiency fractures (diagnosed by MRI in June). 4. Chronic right inferior pubic ramus fracture. 5. No acetabular or proximal femur fractures. 6. Large 9 cm left adnexal cystic mass again noted with consensus criteria recommending Pelvic Ultrasound for further characterization. 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 7.  Calcified aortic atherosclerosis. Electronically Signed   By: Odessa FlemingH  Hall M.D.   On: 11/13/2015 18:40   Dg Hip Unilat W Or Wo Pelvis 2-3 Views Right  Result Date: 11/13/2015 CLINICAL DATA:  Pain following fall EXAM: DG HIP (WITH OR WITHOUT PELVIS) 2-3V RIGHT COMPARISON:  None. FINDINGS: Frontal pelvis as well as frontal and lateral right hip images were obtained. There is evidence of an old healed fracture of the left superior pubic ramus. There is a prior fracture of the medial left ischium, age uncertain but probably nonacute. There is no demonstrable acute fracture or dislocation. There is moderate symmetric narrowing of both hip joints. No erosive change. Bones are osteoporotic. IMPRESSION: Bones osteoporotic. Old healed fracture superior pubic ramus on the left. Age uncertain but probable chronic fracture medial left ischium in anatomic alignment. No acute fracture or dislocation. Symmetric narrowing both hip joints. Electronically Signed   By: Bretta BangWilliam  Woodruff III M.D.   On: 11/13/2015 14:38    EKG:  Orders placed or performed during the hospital encounter of 11/13/15  . ED EKG  . ED EKG  . EKG 12-Lead  . EKG 12-Lead    ASSESSMENT AND PLAN:  Active Problems:   Pelvic fracture, closed, initial encounter #1 pelvic fracture, closed. Continue pain management, physical therapy.  Patient was seen by orthopedist surgeon, appreciate recommendations, pain is relatively well-controlled, possible discharge to skilled nursing facility tomorrow, if no new changes #2. Hypokalemia, supplementing orally, follow in the morning. Magnesium level was checked and it was found to be normal.  #3 anemia, seems to be relatively stable, following #4. Dementia without behavioral disturbance, supportive therapy, skilled nursing facility placement for rehabilitation, likely tomorrow. Oral intake is fair Management plans discussed with the patient, family and they are in  agreement.   DRUG ALLERGIES: No Known Allergies  CODE STATUS:     Code Status Orders        Start     Ordered   11/13/15 1936  Full code  Continuous     11/13/15 1935    Code Status History    Date Active Date Inactive Code Status Order ID Comments User Context   10/21/2015 11:57 AM 10/23/2015  4:27 PM Full Code [id number]  Alford Highland, MD ED    Advance Directive Documentation   Flowsheet Row Most Recent Value  Type of Advance Directive  Healthcare Power of Attorney  Pre-existing out of facility DNR order (yellow form or pink MOST form)  No data  "MOST" Form in Place?  No data      TOTAL TIME TAKING CARE OF THIS PATIENT: 30 minutes.    Katharina Caper M.D on 11/15/2015 at 11:27 AM  Between 7am to 6pm - Pager - [phone]  After 6pm go to [email or web address] - password [email or web address] [facility]  [facility]  Office  [phone]  CC: Primary care physician; Barbette Reichmann, MD

## 2015-11-15 NOTE — Progress Notes (Signed)
Patient is alert to self only. [facility] strong assist x2. Incont of urine at times, LBM this shift. Fair appetite. Gave pain med x1, will good relief. Took meds this am without difficulty. Refused PM med including pain meds.  Patient can become argumentative and refuse care. Can ask questions repeatily and can get frustrated.  Bed alarm on for safety. Don't use call light approp. Room next to the nurses' station. Mats on floor d/t high fall risk.

## 2015-11-15 NOTE — Clinical Social Work Note (Signed)
Clinical Social Work Assessment  Patient Details  Name: Michelle Ballard MRN: [id number] Date of Birth: 1931/01/02  Date of referral:  11/15/15               Reason for consult:  Facility Placement, Discharge Planning                Permission sought to share information with:  Facility Industrial/product designerContact Representative Permission granted to share information::  Yes, Release of Information Signed  Name::        Agency::     Relationship::     Contact Information:     Housing/Transportation Living arrangements for the past 2 months:  Assisted Living Facility Source of Information:  Facility Patient Interpreter Needed:  None Criminal Activity/Legal Involvement Pertinent to Current Situation/Hospitalization:  No - Comment as needed Significant Relationships:  Adult Children Lives with:  Facility Resident Do you feel safe going back to the place where you live?  Yes Need for family participation in patient care:  Yes (Comment)  Care giving concerns: Patient admitted from facility ([facility])   Social Worker assessment / plan: Patient was alert and oriented to self. Patient was pleasant but suspicious of the CSW reason to call family. Patient has dementia and is a poor historian. All information from Facility representative.  Patient is baseline confused and often agitated. Patient uses a wheelchair and often will not stay seated or tries to ambulate unattended. Patient has fallen multiple times and is in hospital for a hip fracture stemming from a fall. Patient is independent with feeding, needs total assistance with dressing, and can ambulate with assistance but has leg weakness. Patient is typically continent and has assistance getting to the toilet, but can toilet on her own.  CSW attempted to contact family with no answer. PT recommends SNF for d/c.  Unable to obtain PASRR due to website issues and office closure on the weekends.  Employment status:  Retired Automotive engineernsurance information:   Managed Medicare PT Recommendations:  Skilled Nursing Facility Information / Referral to community resources:     Patient/Family's Response to care:  Pleasant but confused.  Patient/Family's Understanding of and Emotional Response to Diagnosis, Current Treatment, and Prognosis: Patient unable to be involved in care.  Emotional Assessment Appearance:  Appears stated age Attitude/Demeanor/Rapport:  Suspicious (Patient was pleasant but suspicious at times.) Affect (typically observed):  Appropriate, Pleasant (Patient quite pleasant) Orientation:  Oriented to Self Alcohol / Substance use:  Never Used Psych involvement (Current and /or in the community):  No (Comment)  Discharge Needs  Concerns to be addressed:  Discharge Planning Concerns Readmission within the last 30 days:  Yes Current discharge risk:  Cognitively Impaired, Physical Impairment Barriers to Discharge:  No Barriers Identified   Michelle CongKaren M [name], LCSW 11/15/2015, 5:22 PM

## 2015-11-15 NOTE — Progress Notes (Signed)
  Subjective:   Nondisplaced superior and inferior pubic rami fractures on the left, nondisplaced inferior pubic ramus fracture on the right and subacute healing bilateral sacral insufficiency fractures, DAY 2 Patient reports pain as 0 out of 10 while laying in bed this morning, jumps up to an 6 with any attempted motion..  Patient is well, and has had no acute complaints or problems Plan is to go Skilled nursing facility after hospital stay. Negative for chest pain and shortness of breath Fever: no Gastrointestinal:negative for nausea and vomiting  Objective: Vital signs in last 24 hours: Temp:  [98 F (36.7 C)-98.9 F (37.2 C)] 98.9 F (37.2 C) (08/06 0443) Pulse Rate:  [63-74] 63 (08/06 0443) Resp:  [16-18] 16 (08/06 0443) BP: (101-132)/(51-59) 101/51 (08/06 0443) SpO2:  [90 %-94 %] 90 % (08/06 0443)  Intake/Output from previous day:  Intake/Output Summary (Last 24 hours) at 11/15/15 0840 Last data filed at 11/14/15 1200  Gross per 24 hour  Intake              240 ml  Output                0 ml  Net              240 ml    Intake/Output this shift: No intake/output data recorded.  Labs:  Recent Labs  11/13/15 1321 11/15/15 0435  HGB 11.3* 10.0*    Recent Labs  11/13/15 1321 11/15/15 0435  WBC 10.7 8.1  RBC 5.78* 5.21*  HCT 35.2 31.9*  PLT 289 241    Recent Labs  11/13/15 1321 11/15/15 0435  NA 141 141  K 3.4* 3.3*  CL 108 110  CO2 25 26  BUN 16 15  CREATININE 0.84 0.72  GLUCOSE 134* 88  CALCIUM 8.9 8.2*   No results for input(s): LABPT, INR in the last 72 hours.   EXAM General - Patient is Disorganized, Confused and Lacking Extremity - ABD soft Neurovascular intact Sensation intact distally Intact pulses distally  Mild pain bilaterally with log-rolling maneuvers Increased pain with passive hip flexion bilaterally Motor Function - intact, moving foot and toes well on exam. Negative Homan's bilaterally  Past Medical History:  Diagnosis  Date  . Dementia   . Multiple pelvic fractures (HCC)     Assessment/Plan:    Active Problems:   Pelvic fracture, closed, initial encounter  Estimated body mass index is 24.89 kg/m as calculated from the following:   Height as of this encounter: 5\' 4"  (1.626 m).   Weight as of this encounter: 65.8 kg (145 lb). Advance diet Up with therapy   Up with therapy today, with a walker and assistance. History difficult to obtain, but denies any SOB or N/V. K+ 3.3 this AM, pt on Klor-Con 40 mEq every 6 hours.  DVT Prophylaxis - Lovenox, Foot Pumps and TED hose Weight-Bearing as tolerated to both legs  J. Horris LatinoLance McGhee, PA-C [facility] 11/15/2015, 8:40 AM

## 2015-11-16 ENCOUNTER — Encounter
Admission: RE | Admit: 2015-11-16 | Discharge: 2015-11-16 | Disposition: A | Discharge status: Home / Self Care | Payer: Commercial Managed Care - HMO | Source: Ambulatory Visit | Attending: Internal Medicine | Admitting: Internal Medicine

## 2015-11-16 ENCOUNTER — Inpatient Hospital Stay: Payer: Commercial Managed Care - HMO

## 2015-11-16 DIAGNOSIS — E538 Deficiency of other specified B group vitamins: Secondary | ICD-10-CM | POA: Diagnosis not present

## 2015-11-16 DIAGNOSIS — F3341 Major depressive disorder, recurrent, in partial remission: Secondary | ICD-10-CM | POA: Diagnosis not present

## 2015-11-16 DIAGNOSIS — F329 Major depressive disorder, single episode, unspecified: Secondary | ICD-10-CM | POA: Diagnosis not present

## 2015-11-16 DIAGNOSIS — M150 Primary generalized (osteo)arthritis: Secondary | ICD-10-CM | POA: Diagnosis not present

## 2015-11-16 DIAGNOSIS — Z09 Encounter for follow-up examination after completed treatment for conditions other than malignant neoplasm: Secondary | ICD-10-CM | POA: Diagnosis not present

## 2015-11-16 DIAGNOSIS — N83202 Unspecified ovarian cyst, left side: Secondary | ICD-10-CM | POA: Diagnosis not present

## 2015-11-16 DIAGNOSIS — F0391 Unspecified dementia with behavioral disturbance: Secondary | ICD-10-CM

## 2015-11-16 DIAGNOSIS — S32810A Multiple fractures of pelvis with stable disruption of pelvic ring, initial encounter for closed fracture: Secondary | ICD-10-CM | POA: Diagnosis not present

## 2015-11-16 DIAGNOSIS — S329XXD Fracture of unspecified parts of lumbosacral spine and pelvis, subsequent encounter for fracture with routine healing: Secondary | ICD-10-CM | POA: Diagnosis not present

## 2015-11-16 DIAGNOSIS — R102 Pelvic and perineal pain: Secondary | ICD-10-CM | POA: Diagnosis not present

## 2015-11-16 DIAGNOSIS — R42 Dizziness and giddiness: Secondary | ICD-10-CM | POA: Diagnosis not present

## 2015-11-16 DIAGNOSIS — G308 Other Alzheimer's disease: Secondary | ICD-10-CM | POA: Diagnosis not present

## 2015-11-16 DIAGNOSIS — R109 Unspecified abdominal pain: Secondary | ICD-10-CM | POA: Diagnosis not present

## 2015-11-16 DIAGNOSIS — I700 Atherosclerosis of aorta: Secondary | ICD-10-CM | POA: Diagnosis not present

## 2015-11-16 DIAGNOSIS — F0150 Vascular dementia without behavioral disturbance: Secondary | ICD-10-CM | POA: Diagnosis not present

## 2015-11-16 DIAGNOSIS — M4725 Other spondylosis with radiculopathy, thoracolumbar region: Secondary | ICD-10-CM | POA: Diagnosis not present

## 2015-11-16 DIAGNOSIS — R410 Disorientation, unspecified: Secondary | ICD-10-CM | POA: Insufficient documentation

## 2015-11-16 DIAGNOSIS — D649 Anemia, unspecified: Secondary | ICD-10-CM

## 2015-11-16 DIAGNOSIS — F03918 Unspecified dementia, unspecified severity, with other behavioral disturbance: Secondary | ICD-10-CM

## 2015-11-16 DIAGNOSIS — R4789 Other speech disturbances: Secondary | ICD-10-CM | POA: Diagnosis not present

## 2015-11-16 DIAGNOSIS — S329XXA Fracture of unspecified parts of lumbosacral spine and pelvis, initial encounter for closed fracture: Secondary | ICD-10-CM | POA: Diagnosis not present

## 2015-11-16 DIAGNOSIS — E876 Hypokalemia: Secondary | ICD-10-CM | POA: Diagnosis not present

## 2015-11-16 DIAGNOSIS — S3991XA Unspecified injury of abdomen, initial encounter: Secondary | ICD-10-CM | POA: Diagnosis not present

## 2015-11-16 DIAGNOSIS — I714 Abdominal aortic aneurysm, without rupture: Secondary | ICD-10-CM | POA: Diagnosis not present

## 2015-11-16 DIAGNOSIS — Z743 Need for continuous supervision: Secondary | ICD-10-CM | POA: Diagnosis not present

## 2015-11-16 DIAGNOSIS — Z9181 History of falling: Secondary | ICD-10-CM | POA: Diagnosis not present

## 2015-11-16 DIAGNOSIS — F0390 Unspecified dementia without behavioral disturbance: Secondary | ICD-10-CM | POA: Diagnosis not present

## 2015-11-16 DIAGNOSIS — I77811 Abdominal aortic ectasia: Secondary | ICD-10-CM | POA: Diagnosis not present

## 2015-11-16 DIAGNOSIS — R262 Difficulty in walking, not elsewhere classified: Secondary | ICD-10-CM | POA: Diagnosis not present

## 2015-11-16 DIAGNOSIS — M419 Scoliosis, unspecified: Secondary | ICD-10-CM | POA: Diagnosis not present

## 2015-11-16 DIAGNOSIS — R1909 Other intra-abdominal and pelvic swelling, mass and lump: Secondary | ICD-10-CM | POA: Diagnosis not present

## 2015-11-16 DIAGNOSIS — M47812 Spondylosis without myelopathy or radiculopathy, cervical region: Secondary | ICD-10-CM | POA: Diagnosis not present

## 2015-11-16 DIAGNOSIS — R296 Repeated falls: Secondary | ICD-10-CM | POA: Diagnosis not present

## 2015-11-16 DIAGNOSIS — S32810D Multiple fractures of pelvis with stable disruption of pelvic ring, subsequent encounter for fracture with routine healing: Secondary | ICD-10-CM | POA: Diagnosis not present

## 2015-11-16 DIAGNOSIS — F3289 Other specified depressive episodes: Secondary | ICD-10-CM | POA: Diagnosis not present

## 2015-11-16 DIAGNOSIS — M6281 Muscle weakness (generalized): Secondary | ICD-10-CM | POA: Diagnosis not present

## 2015-11-16 DIAGNOSIS — M810 Age-related osteoporosis without current pathological fracture: Secondary | ICD-10-CM | POA: Diagnosis not present

## 2015-11-16 DIAGNOSIS — R52 Pain, unspecified: Secondary | ICD-10-CM | POA: Diagnosis not present

## 2015-11-16 LAB — HEMOGLOBIN: HEMOGLOBIN: 10.3 g/dL — AB (ref 12.0–16.0)

## 2015-11-16 LAB — POTASSIUM: POTASSIUM: 3.3 mmol/L — AB (ref 3.5–5.1)

## 2015-11-16 LAB — VITAMIN D 25 HYDROXY (VIT D DEFICIENCY, FRACTURES): Vit D, 25-Hydroxy: 8.9 ng/mL — ABNORMAL LOW (ref 30.0–100.0)

## 2015-11-16 MED ORDER — LORAZEPAM 0.5 MG PO TABS: 0.5000 mg | ORAL_TABLET | Freq: Two times a day (BID) | ORAL | 0 refills | Status: DC

## 2015-11-16 MED ORDER — POLYETHYLENE GLYCOL 3350 17 G PO PACK: 17.0000 g | PACK | Freq: Every day | ORAL | 0 refills | Status: DC | PRN

## 2015-11-16 MED ORDER — BISACODYL 10 MG RE SUPP
10.0000 mg | Freq: Once | RECTAL | Status: AC
  Administered 2015-11-16: 10 mg via RECTAL
  Filled 2015-11-16: qty 1

## 2015-11-16 MED ORDER — LORAZEPAM 1 MG PO TABS: 1.0000 mg | ORAL_TABLET | Freq: Two times a day (BID) | ORAL | 0 refills | Status: DC | PRN

## 2015-11-16 MED ORDER — DOCUSATE SODIUM 100 MG PO CAPS: 100.0000 mg | ORAL_CAPSULE | Freq: Two times a day (BID) | ORAL | 0 refills | Status: DC

## 2015-11-16 MED ORDER — KCL IN DEXTROSE-NACL 20-5-0.45 MEQ/L-%-% IV SOLN
INTRAVENOUS | Status: DC
  Administered 2015-11-16: 10:00:00 via INTRAVENOUS
  Filled 2015-11-16 (×2): qty 1000

## 2015-11-16 MED ORDER — HYDROCODONE-ACETAMINOPHEN 5-325 MG PO TABS: 0.5000 | ORAL_TABLET | Freq: Four times a day (QID) | ORAL | 0 refills | Status: DC | PRN

## 2015-11-16 NOTE — Progress Notes (Signed)
Subjective: Patient resting comfortably and easily arousable. She does appear somewhat depressed. No new complaints this morning.   Objective: Vital signs in last 24 hours: Temp:  [98.2 F (36.8 C)-99.1 F (37.3 C)] 98.2 F (36.8 C) (08/07 0415) Pulse Rate:  [68-73] 68 (08/07 0415) Resp:  [18-24] 24 (08/07 0415) BP: (118-129)/(48-63) 120/48 (08/07 0415) SpO2:  [90 %-97 %] 92 % (08/07 0415)  Intake/Output from previous day: 08/06 0701 - 08/07 0700 In: 295 [P.O.:295] Out: 201 [Urine:200; Stool:1] Intake/Output this shift: No intake/output data recorded.   Recent Labs  11/13/15 1321 11/15/15 0435 11/16/15 0513  HGB 11.3* 10.0* 10.3*    Recent Labs  11/13/15 1321 11/15/15 0435  WBC 10.7 8.1  RBC 5.78* 5.21*  HCT 35.2 31.9*  PLT 289 241    Recent Labs  11/13/15 1321 11/15/15 0435 11/16/15 0513  NA 141 141  --   K 3.4* 3.3* 3.3*  CL 108 110  --   CO2 25 26  --   BUN 16 15  --   CREATININE 0.84 0.72  --   GLUCOSE 134* 88  --   CALCIUM 8.9 8.2*  --    No results for input(s): LABPT, INR in the last 72 hours.  Physical Exam: Unchanged versus yesterday. She remains neurovascularly intact in both lower extremities.  Assessment: S/P multiple non-displaced pelvic fractures.  Plan: Continue present medical management and physical therapy. Patient may continue to be mobilized with therapy, weightbearing as tolerated on both lower extremities. The patient is cleared for transfer to rehabilitation for further convalescence from an orthopedic standpoint. She is to follow-up with orthopedics in 4-6 weeks.  Thank you for asking us to participate in the care of this unfortunate woman. We will sign off on her care at this time. If you have further questions, please contact us.    Excell SeltzerJohn J Kelseigh Diver 11/16/2015, 7:58 AM

## 2015-11-16 NOTE — Care Management Important Message (Signed)
Important Message  Patient Details  Name: Michelle Ballard MRN: [id number] Date of Birth: Nov 01, 1930   Medicare Important Message Given:  Yes    Adonis HugueninBerkhead, Elwanda Moger L, RN 11/16/2015, 9:20 AM

## 2015-11-16 NOTE — Progress Notes (Signed)
Clinical Child psychotherapistocial Worker (CSW) contacted patient's son Fayrene FearingJames and presented bed offers. [facility].   Patient is medically stable for [facility]. Per Kim [facility] room [facility]A. RN will call report at [phone] and arrange EMS for transport. CS[facility]. [facility] has been received. Auth # [id number]. Patient is aware of above. Patient's son Fayrene FearingJames is aware of above. Please reconsult if future social work needs arise. CSW signing off.   [employer] Hughes IncorporatedBailey Brice Kossman, LCSW [phone]

## 2015-11-16 NOTE — Progress Notes (Signed)
Report called to Elizabeth at [facility]. EMS called for transportation. 

## 2015-11-16 NOTE — Clinical Social Work Placement (Signed)
   CLINICAL SOCIAL WORK PLACEMENT  NOTE  Date:  11/16/2015  Patient Details  Name: Michelle Ballard MRN: [id number] Date of Birth: Aug 23, 1930  Clinical Social Work is seeking post-discharge placement for this patient at the Skilled  Nursing Facility level of care (*CSW will initial, date and re-position this form in  chart as items are completed):  Yes   Patient/family provided with [place] Clinical Social Work Department's list of facilities offering this level of care within the geographic area requested by the patient (or if unable, by the patient's family).  Yes   Patient/family informed of their freedom to choose among providers that offer the needed level of care, that participate in Medicare, Medicaid or managed care program needed by the patient, have an available bed and are willing to accept the patient.  Yes   Patient/family informed of [facility]'s ownership interest in [facility] and [facility], as well as of the fact that they are under no obligation to receive care at these facilities.  PASRR submitted to EDS on       PASRR number received on       Existing PASRR number confirmed on 11/16/15     FL2 transmitted to all facilities in geographic area requested by pt/family on 11/16/15     FL2 transmitted to all facilities within larger geographic area on       Patient informed that his/her managed care company has contracts with or will negotiate with certain facilities, including the following:        Yes   Patient/family informed of bed offers received.  Patient chooses bed at  [facility] )     Physician recommends and patient chooses bed at      Patient to be transferred to  [facility] ) on 11/16/15.  Patient to be transferred to facility by  [facility] )     Patient family notified on 11/16/15 of transfer.  Name of family member notified:   (Patient's son Michelle Ballard is aware of D/C today. )     PHYSICIAN       Additional  Comment:    _______________________________________________ Rajvir Ernster, Darleen CrockerBailey M, LCSW 11/16/2015, 12:17 PM

## 2015-11-16 NOTE — Progress Notes (Signed)
Clinical Social Worker (CSW) contacted patient's son Fayrene FearingJames to discuss D/C plan. CSW is familiar with patient and family from previous admission. Per son [facility] went [facility] from 10/[facility]/15 to 11/12/15. [facility] and fell again and has new pelvic fractures. Per son they would like for patient to go to rehab again. CSW made son aware that patient will be in her co-pay days for SNF and that [facility] will have to approve SNF again. Son requested [facility] again if they have availability. CSW also discussed long term care SNF options with son including private pay and Medicaid. Per son the goal is for patient to go to rehab and then return to [facility]. CSW made son that patient will likely be D/C today. CSW will continue to follow and assist as needed.   [employer]Bailey Kyani Simkin, LCSW [phone]

## 2015-11-16 NOTE — Progress Notes (Signed)
Physical Therapy Treatment Patient Details Name: Michelle Ballard MRN: [id number] DOB: December 11, 1930 Today's Date: 11/16/2015    History of Present Illness Pt here with fall and pelvic fx ~3 weeks ago, now here with another fall and pubic rami fx.  Pt with severe dementia, difficult to convince her to participate.    PT Comments    Confusion limiting session.  Participated in exercises as described below.  Pt resistive to session but was able to get pt to participate in limited exercises.  Refused bed mobility or out of bed activities.    Follow Up Recommendations  SNF     Equipment Recommendations       Recommendations for Other Services       Precautions / Restrictions Precautions Precautions: Fall Precaution Comments: Hx of falls, pt is impulsive and confused. pelvic Fx s/p several weeks, acute rib Fx possible Restrictions Weight Bearing Restrictions: No    Mobility  Bed Mobility               General bed mobility comments: refused  Transfers                 General transfer comment: deferred  Ambulation/Gait             General Gait Details: deferred   Stairs            Wheelchair Mobility    Modified Rankin (Stroke Patients Only)       Balance                                    Cognition Arousal/Alertness: Awake/alert Behavior During Therapy: Agitated Overall Cognitive Status: History of cognitive impairments - at baseline       Memory: Decreased short-term memory              Exercises Other Exercises Other Exercises: BLE AAROM for ankle pumps, heel slides, AB/Adduction ex.    General Comments        Pertinent Vitals/Pain Faces Pain Scale: Hurts little more Pain Location: poor description of pain Pain Intervention(s): Limited activity within patient's tolerance    Home Living                      Prior Function            PT Goals (current goals can now be found in the care plan  section) Acute Rehab PT Goals Patient Stated Goal: unable to state    Frequency  Min 2X/week    PT Plan Current plan remains appropriate    Co-evaluation             End of Session   Activity Tolerance: Treatment limited secondary to agitation Patient left: with bed alarm set;with call bell/phone within reach     Time: [id number] PT Time Calculation (min) (ACUTE ONLY): 11 min  Charges:  $Therapeutic Exercise: 8-22 mins                    G Codes:      Danielle DessSarah Samanvitha Germany, PTA 11/16/15, 10:45 AM

## 2015-11-16 NOTE — Consult Note (Signed)
   [facility]   11/16/2015  Michelle Ballard 07/01/1930 [id number]  Patient screened for potential [facility] Care Management services. Patient is eligible for [facility] Services. Electronic medical record reveals patient's discharge plan is SNF. [facility] Care Management services not appropriate at this time. If patient's post hospital needs change please place a [facility] Care Management consult. For questions please contact:   Pieter Fooks RN, BSN [facility]  Hospital Liaison  [phone]) Business Mobile [phone]) Toll free office

## 2015-11-16 NOTE — Clinical Social Work Placement (Signed)
   CLINICAL SOCIAL WORK PLACEMENT  NOTE  Date:  11/16/2015  Patient Details  Name: Michelle Ballard MRN: [id number] Date of Birth: Mar 02, 1931  Clinical Social Work is seeking post-discharge placement for this patient at the Skilled  Nursing Facility level of care (*CSW will initial, date and re-position this form in  chart as items are completed):  Yes   Patient/family provided with [place] Clinical Social Work Department's list of facilities offering this level of care within the geographic area requested by the patient (or if unable, by the patient's family).  Yes   Patient/family informed of their freedom to choose among providers that offer the needed level of care, that participate in Medicare, Medicaid or managed care program needed by the patient, have an available bed and are willing to accept the patient.  Yes   Patient/family informed of [facility]'s ownership interest in [facility] and [facility], as well as of the fact that they are under no obligation to receive care at these facilities.  PASRR submitted to EDS on       PASRR number received on       Existing PASRR number confirmed on 11/16/15     FL2 transmitted to all facilities in geographic area requested by pt/family on 11/16/15     FL2 transmitted to all facilities within larger geographic area on       Patient informed that his/her managed care company has contracts with or will negotiate with certain facilities, including the following:            Patient/family informed of bed offers received.  Patient chooses bed at       Physician recommends and patient chooses bed at      Patient to be transferred to   on  .  Patient to be transferred to facility by       Patient family notified on   of transfer.  Name of family member notified:        PHYSICIAN       Additional Comment:    _______________________________________________ Nyja Westbrook, Darleen CrockerBailey M, LCSW 11/16/2015, 8:57 AM

## 2015-11-16 NOTE — Discharge Summary (Signed)
[facility] [place] at [facility]   PATIENT NAME: Michelle Ballard    MR#:  [id number]  DATE OF BIRTH:  1930-12-16  DATE OF ADMISSION:  11/13/2015 ADMITTING PHYSICIAN: Wyatt Haste, MD  DATE OF DISCHARGE: No discharge date for patient encounter.  PRIMARY CARE PHYSICIAN: Barbette Reichmann, MD     ADMISSION DIAGNOSIS:  Fall, initial encounter [W19.XXXA] Multiple pelvic fractures, closed, initial encounter [S32.810A] Dementia, without behavioral disturbance [F03.90]  DISCHARGE DIAGNOSIS:  Principal Problem:   Pelvic fracture, closed, initial encounter Active Problems:   Hypokalemia   Dementia with behavioral disturbance   Anemia   SECONDARY DIAGNOSIS:   Past Medical History:  Diagnosis Date  . Dementia   . Multiple pelvic fractures (HCC)     .pro HOSPITAL COURSE:   The patient is a 82 year old Caucasian female with past medical history significant for history of dementia, multiple pelvic fractures who presents to the hospital with complaints of fall and pain, apparently falling out from her wheelchair, landing on the left side. On arrival to emergency room, she had x-rays done as well as CT scan of the pelvis, which revealed nondisplaced pelvic fractures. Patient admits of some mild abdominal pain, not able to elaborate due to dementia. She was seen by orthopedist surgeon, who recommended continue mobilization with therapy, weightbearing as tolerated on both lower extremities. The patient was cleared for transfer to rehabilitation for further convalescence from an orthopedic standpoint. She was recommended to follow-up with orthopedics in 4-6 weeks. However, her stay in the hospital time, she was stable clinically. Discussion by problem: #1 pelvic fracture, closed. Continue pain management, physical therapy. Patient was seen by orthopedist surgeon, recommended to continue mobilization with physical therapy, weightbearing as tolerated on both lower  extremities, follow-up with orthopedic surgeon in 4-6 weeks.The  pain is relatively well-controlled, discharge to skilled nursing facility today, if bed is ready #2. Hypokalemia, supplemented orally, follow as outpatient. Magnesium level was checked and it was found to be normal.  #3 anemia, seems to be relatively stable, follow as outpatient #4. Dementia with behavioral disturbance, supportive therapy, skilled nursing facility placement for rehabilitation, continue medications as previously scheduled, including Seroquel, Namenda, Zoloft and Ativan. Oral intake is fair  DISCHARGE CONDITIONS:   Stable  CONSULTS OBTAINED:  Treatment Team:  Wyatt Haste, MD Christena Flake, MD  DRUG ALLERGIES:  No Known Allergies  DISCHARGE MEDICATIONS:   Current Discharge Medication List    START taking these medications   Details  docusate sodium (COLACE) 100 MG capsule Take 1 capsule (100 mg total) by mouth 2 (two) times daily. Qty: 10 capsule, Refills: 0    polyethylene glycol (MIRALAX / GLYCOLAX) packet Take 17 g by mouth daily as needed for mild constipation. Qty: 14 each, Refills: 0      CONTINUE these medications which have CHANGED   Details  HYDROcodone-acetaminophen (NORCO/VICODIN) 5-325 MG tablet Take 0.5 tablets by mouth every 6 (six) hours as needed for moderate pain or severe pain. Qty: 20 tablet, Refills: 0    !! LORazepam (ATIVAN) 0.5 MG tablet Take 1 tablet (0.5 mg total) by mouth 2 (two) times daily. Qty: 30 tablet, Refills: 0    !! LORazepam (ATIVAN) 1 MG tablet Take 1 tablet (1 mg total) by mouth 2 (two) times daily as needed for anxiety. Qty: 30 tablet, Refills: 0     !! - Potential duplicate medications found. Please discuss with provider.    CONTINUE these medications which have NOT CHANGED  Details  diclofenac sodium (VOLTAREN) 1 % GEL Apply 2 g topically 2 (two) times daily.    donepezil (ARICEPT) 10 MG tablet Take 10 mg by mouth daily.     memantine (NAMENDA) 10  MG tablet Take 10 mg by mouth 2 (two) times daily.    QUEtiapine (SEROQUEL) 25 MG tablet Take 25 mg by mouth at bedtime.    sertraline (ZOLOFT) 100 MG tablet Take 100 mg by mouth daily.         DISCHARGE INSTRUCTIONS:    Patient is to follow-up with primary care physician and orthopedic surgeon as outpatient  If you experience worsening of your admission symptoms, develop shortness of breath, life threatening emergency, suicidal or homicidal thoughts you must seek medical attention immediately by calling 911 or calling your MD immediately  if symptoms less severe.  You Must read complete instructions/literature along with all the possible adverse reactions/side effects for all the Medicines you take and that have been prescribed to you. Take any new Medicines after you have completely understood and accept all the possible adverse reactions/side effects.   Please note  You were cared for by a hospitalist during your hospital stay. If you have any questions about your discharge medications or the care you received while you were in the hospital after you are discharged, you can call the unit and asked to speak with the hospitalist on call if the hospitalist that took care of you is not available. Once you are discharged, your primary care physician will handle any further medical issues. Please note that NO REFILLS for any discharge medications will be authorized once you are discharged, as it is imperative that you return to your primary care physician (or establish a relationship with a primary care physician if you do not have one) for your aftercare needs so that they can reassess your need for medications and monitor your lab values.    Today   CHIEF COMPLAINT:   Chief Complaint  Patient presents with  . Fall    HISTORY OF PRESENT ILLNESS:  Michelle Ballard  is a 82 y.o. female with a known history of dementia, multiple pelvic fractures who presents to the hospital with complaints  of fall and pain, apparently falling out from her wheelchair, landing on the left side. On arrival to emergency room, she had x-rays done as well as CT scan of the pelvis, which revealed nondisplaced pelvic fractures. Patient admits of some mild abdominal pain, not able to elaborate due to dementia. She was seen by orthopedist surgeon, who recommended continue mobilization with therapy, weightbearing as tolerated on both lower extremities. The patient was cleared for transfer to rehabilitation for further convalescence from an orthopedic standpoint. She was recommended to follow-up with orthopedics in 4-6 weeks. However, her stay in the hospital time, she was stable clinically. Discussion by problem: #1 pelvic fracture, closed. Continue pain management, physical therapy. Patient was seen by orthopedist surgeon, recommended to continue mobilization with physical therapy, weightbearing as tolerated on both lower extremities, follow-up with orthopedic surgeon in 4-6 weeks.The  pain is relatively well-controlled, discharge to skilled nursing facility today, if bed is ready #2. Hypokalemia, supplemented orally, follow as outpatient. Magnesium level was checked and it was found to be normal.  #3 anemia, seems to be relatively stable, follow as outpatient #4. Dementia with behavioral disturbance, supportive therapy, skilled nursing facility placement for rehabilitation, continue medications as previously scheduled, including Seroquel, Namenda, Zoloft and Ativan. Oral intake is fair    VITAL  SIGNS:  Blood pressure (!) 124/49, pulse 68, temperature 98 F (36.7 C), temperature source Oral, resp. rate 12, height 5\' 4"  (1.626 m), weight 65.8 kg (145 lb), SpO2 92 %.  I/O:   Intake/Output Summary (Last 24 hours) at 11/16/15 0903 Last data filed at 11/16/15 0500  Gross per 24 hour  Intake              295 ml  Output              201 ml  Net               94 ml    PHYSICAL EXAMINATION:  GENERAL:  82  y.o.-year-old patient lying in the bed with no acute distress.  EYES: Pupils equal, round, reactive to light and accommodation. No scleral icterus. Extraocular muscles intact.  HEENT: Head atraumatic, normocephalic. Oropharynx and nasopharynx clear.  NECK:  Supple, no jugular venous distention. No thyroid enlargement, no tenderness.  LUNGS: Normal breath sounds bilaterally, no wheezing, rales,rhonchi or crepitation. No use of accessory muscles of respiration.  CARDIOVASCULAR: S1, S2 normal. No murmurs, rubs, or gallops.  ABDOMEN: Soft, non-tender, non-distended. Bowel sounds present. No organomegaly or mass.  EXTREMITIES: No pedal edema, cyanosis, or clubbing.  NEUROLOGIC: Cranial nerves II through XII are intact. Muscle strength 5/5 in all extremities. Sensation intact. Gait not checked.  PSYCHIATRIC: The patient is alert and oriented x 3.  SKIN: No obvious rash, lesion, or ulcer.   DATA REVIEW:   CBC  Recent Labs Lab 11/15/15 0435 11/16/15 0513  WBC 8.1  --   HGB 10.0* 10.3*  HCT 31.9*  --   PLT 241  --     Chemistries   Recent Labs Lab 11/15/15 0435 11/16/15 0513  NA 141  --   K 3.3* 3.3*  CL 110  --   CO2 26  --   GLUCOSE 88  --   BUN 15  --   CREATININE 0.72  --   CALCIUM 8.2*  --   MG 2.0  --     Cardiac Enzymes No results for input(s): TROPONINI in the last 168 hours.  Microbiology Results  Results for orders placed or performed during the hospital encounter of 11/13/15  MRSA PCR Screening     Status: None   Collection Time: 11/13/15 10:58 PM  Result Value Ref Range Status   MRSA by PCR NEGATIVE NEGATIVE Final    Comment:        The GeneXpert MRSA Assay (FDA approved for NASAL specimens only), is one component of a comprehensive MRSA colonization surveillance program. It is not intended to diagnose MRSA infection nor to guide or monitor treatment for MRSA infections.     RADIOLOGY:  No results found.  EKG:   Orders placed or performed  during the hospital encounter of 11/13/15  . ED EKG  . ED EKG  . EKG 12-Lead  . EKG 12-Lead      Management plans discussed with the patient, family and they are in agreement.  CODE STATUS:     Code Status Orders        Start     Ordered   11/13/15 1936  Full code  Continuous     11/13/15 1935    Code Status History    Date Active Date Inactive Code Status Order ID Comments User Context   10/21/2015 11:57 AM 10/23/2015  4:27 PM Full Code [id number]  Alford Highland, MD ED    Advance Directive  Documentation   Flowsheet Row Most Recent Value  Type of Advance Directive  Healthcare Power of Attorney  Pre-existing out of facility DNR order (yellow form or pink MOST form)  No data  "MOST" Form in Place?  No data      TOTAL TIME TAKING CARE OF THIS PATIENT: 40 minutes.    Katharina Caper M.D on 11/16/2015 at 9:03 AM  Between 7am to 6pm - Pager - [phone]  After 6pm go to [email or web address] - password [email or web address] [facility]  [facility]  Office  [phone]  CC: Primary care physician; Barbette Reichmann, MD

## 2015-11-17 DIAGNOSIS — M810 Age-related osteoporosis without current pathological fracture: Secondary | ICD-10-CM | POA: Diagnosis not present

## 2015-11-17 DIAGNOSIS — G308 Other Alzheimer's disease: Secondary | ICD-10-CM | POA: Diagnosis not present

## 2015-11-26 ENCOUNTER — Non-Acute Institutional Stay (SKILLED_NURSING_FACILITY): Payer: Commercial Managed Care - HMO | Admitting: Gerontology

## 2015-11-26 DIAGNOSIS — F03918 Unspecified dementia, unspecified severity, with other behavioral disturbance: Secondary | ICD-10-CM

## 2015-11-26 DIAGNOSIS — S329XXA Fracture of unspecified parts of lumbosacral spine and pelvis, initial encounter for closed fracture: Secondary | ICD-10-CM

## 2015-11-26 DIAGNOSIS — F0391 Unspecified dementia with behavioral disturbance: Secondary | ICD-10-CM

## 2015-11-27 DIAGNOSIS — M810 Age-related osteoporosis without current pathological fracture: Secondary | ICD-10-CM | POA: Diagnosis not present

## 2015-11-27 DIAGNOSIS — M4725 Other spondylosis with radiculopathy, thoracolumbar region: Secondary | ICD-10-CM | POA: Diagnosis not present

## 2015-11-27 DIAGNOSIS — M419 Scoliosis, unspecified: Secondary | ICD-10-CM | POA: Diagnosis not present

## 2015-11-27 DIAGNOSIS — F329 Major depressive disorder, single episode, unspecified: Secondary | ICD-10-CM | POA: Diagnosis not present

## 2015-11-27 DIAGNOSIS — M47812 Spondylosis without myelopathy or radiculopathy, cervical region: Secondary | ICD-10-CM | POA: Diagnosis not present

## 2015-11-27 DIAGNOSIS — F0390 Unspecified dementia without behavioral disturbance: Secondary | ICD-10-CM | POA: Diagnosis not present

## 2015-11-28 NOTE — Progress Notes (Signed)
Location:      Place of Service:  SNF (31) Provider:  Toni Arthurs, NP-C  Tracie Harrier, MD  Patient Care Team: Tracie Harrier, MD as PCP - General (Internal Medicine)  Extended Emergency Contact Information Primary Emergency Contact: Dunckel,James C Address: [place], [place] [address] [place] Phone: [phone] Work Phone: [phone] Relation: Son Secondary Emergency Contact: [place] Mobile Phone: [phone] Relation: Son  Code Status:  Full Goals of care: Advanced Directive information Advanced Directives 11/13/2015  Does patient have an advance directive? No  Type of Advance Directive Healthcare Power of Attorney  Does patient want to make changes to advanced directive? -  Copy of advanced directive(s) in chart? -  Would patient like information on creating an advanced directive? No - patient declined information     Chief Complaint  Patient presents with  . Follow-up    HPI:  Pt is a 82 y.o. female seen today for follow up s/p pelvic fracture complicated by dementia with behavioral disturbances. She is confused most of the time. She was seen crawling in the hallway the other day. Nursing has been keeping her at the nurses station for closer monitoring. Pt does not seem to mind this as she is frequently napping and chatting with other patients. Though she does call out to visitors and other staff for various "needs." Pt does endorse pain/ hurting "all over," though denies pain at times. Sitter at night time. Pleasant. VSS    Past Medical History:  Diagnosis Date  . Dementia   . Multiple pelvic fractures [facility])    Past Surgical History:  Procedure Laterality Date  . NO PAST SURGERIES      No Known Allergies    Medication List       Accurate as of 11/26/15 11:59 PM. Always use your most recent med list.          diclofenac sodium 1 % Gel Commonly known as:  VOLTAREN Apply 2 g  topically 2 (two) times daily.   docusate sodium 100 MG capsule Commonly known as:  COLACE Take 1 capsule (100 mg total) by mouth 2 (two) times daily.   donepezil 10 MG tablet Commonly known as:  ARICEPT Take 10 mg by mouth daily.   HYDROcodone-acetaminophen 5-325 MG tablet Commonly known as:  NORCO/VICODIN Take 0.5 tablets by mouth every 6 (six) hours as needed for moderate pain or severe pain.   LORazepam 0.5 MG tablet Commonly known as:  ATIVAN Take 1 tablet (0.5 mg total) by mouth 2 (two) times daily.   LORazepam 1 MG tablet Commonly known as:  ATIVAN Take 1 tablet (1 mg total) by mouth 2 (two) times daily as needed for anxiety.   memantine 10 MG tablet Commonly known as:  NAMENDA Take 10 mg by mouth 2 (two) times daily.   polyethylene glycol packet Commonly known as:  MIRALAX / GLYCOLAX Take 17 g by mouth daily as needed for mild constipation.   QUEtiapine 25 MG tablet Commonly known as:  SEROQUEL Take 25 mg by mouth at bedtime.   sertraline 100 MG tablet Commonly known as:  ZOLOFT Take 100 mg by mouth daily.       Review of Systems  Unable to perform ROS: Dementia  Constitutional: Positive for activity change. Negative for appetite change, chills, diaphoresis and fever.  HENT: Negative for sneezing, sore throat, trouble swallowing and voice change.  Respiratory: Negative for apnea, cough, choking, chest tightness, shortness of breath and wheezing.   Cardiovascular: Negative for chest pain, palpitations and leg swelling.  Gastrointestinal: Negative for abdominal distention, abdominal pain, constipation, diarrhea and nausea.  Genitourinary: Negative for difficulty urinating, dysuria, frequency and urgency.  Musculoskeletal: Positive for arthralgias (typical arthritis), back pain and gait problem. Negative for myalgias.  Skin: Negative for color change, pallor, rash and wound.  Neurological: Negative for dizziness, tremors, syncope, speech difficulty, weakness,  numbness and headaches.  Psychiatric/Behavioral: Positive for behavioral problems and confusion. Negative for agitation.  All other systems reviewed and are negative.    There is no immunization history on file for this patient. There are no preventive care reminders to display for this patient. No flowsheet data found. Functional Status Survey:    Vitals:   11/26/15 0500  BP: (!) 119/58  Pulse: (!) 53  Resp: 16  Temp: 97.4 F (36.3 C)  SpO2: 95%  Weight: 126 lb (57.2 kg)   Body mass index is 21.63 kg/m. Physical Exam  Constitutional: Vital signs are normal. She appears well-developed and well-nourished. She is active and cooperative. She does not appear ill. No distress.  HENT:  Head: Normocephalic and atraumatic.  Mouth/Throat: Uvula is midline, oropharynx is clear and moist and mucous membranes are normal. Mucous membranes are not pale, not dry and not cyanotic.  Eyes: Conjunctivae, EOM and lids are normal. Pupils are equal, round, and reactive to light.  Neck: Trachea normal, normal range of motion and full passive range of motion without pain. Neck supple. No JVD present. No tracheal deviation, no edema and no erythema present. No thyromegaly present.  Cardiovascular: Normal rate, regular rhythm, normal heart sounds, intact distal pulses and normal pulses.  Exam reveals no gallop, no distant heart sounds and no friction rub.   No murmur heard. Pulmonary/Chest: Effort normal and breath sounds normal. No accessory muscle usage. No respiratory distress. She has no wheezes. She has no rales. She exhibits no tenderness.  Abdominal: Soft. Normal appearance and bowel sounds are normal. She exhibits no distension and no ascites. There is no tenderness.  Musculoskeletal: Normal range of motion. She exhibits no edema or tenderness.  Expected osteoarthritis, stiffness  Neurological: She is alert. She has normal strength.  Skin: Skin is warm, dry and intact. No rash noted. She is not  diaphoretic. No cyanosis or erythema. No pallor. Nails show no clubbing.  Psychiatric: She has a normal mood and affect. Her speech is normal and behavior is normal. Thought content normal. Cognition and memory are impaired. She expresses impulsivity and inappropriate judgment. She exhibits abnormal recent memory and abnormal remote memory.  Nursing note and vitals reviewed.   Labs reviewed:  Recent Labs  10/22/15 0553 11/13/15 1321 11/15/15 0435 11/16/15 0513  NA 139 141 141  --   K 3.7 3.4* 3.3* 3.3*  CL 109 108 110  --   CO2 '24 25 26  ' --   GLUCOSE 114* 134* 88  --   BUN '18 16 15  ' --   CREATININE 0.75 0.84 0.72  --   CALCIUM 8.6* 8.9 8.2*  --   MG  --   --  2.0  --    No results for input(s): AST, ALT, ALKPHOS, BILITOT, PROT, ALBUMIN in the last 8760 hours.  Recent Labs  10/22/15 0553 11/13/15 1321 11/15/15 0435 11/16/15 0513  WBC 8.7 10.7 8.1  --   HGB 11.5* 11.3* 10.0* 10.3*  HCT 36.7 35.2 31.9*  --  MCV 61.2* 61.0* 61.2*  --   PLT 305 289 241  --    No results found for: TSH No results found for: HGBA1C No results found for: CHOL, HDL, LDLCALC, LDLDIRECT, TRIG, CHOLHDL  Significant Diagnostic Results in last 30 days:  Dg Chest 1 View  Result Date: 11/13/2015 CLINICAL DATA:  Pain following fall EXAM: CHEST 1 VIEW COMPARISON:  None. FINDINGS: There is no edema or consolidation. The heart size and pulmonary vascularity are normal. No adenopathy. There is atherosclerotic calcification in the aorta. No pneumothorax. No fracture evident. There is lower thoracic levoscoliosis. IMPRESSION: No edema or consolidation. No pneumothorax. Aortic atherosclerosis. Electronically Signed   By: Lowella Grip III M.D.   On: 11/13/2015 14:39   Dg Abd 1 View  Result Date: 11/16/2015 CLINICAL DATA:  Status post fall from a wheelchair. Pain. Initial encounter. EXAM: ABDOMEN - 1 VIEW COMPARISON:  CT abdomen and pelvis 11/13/2015. MRI pelvis 09/17/2015 FINDINGS: The bowel gas pattern is  unremarkable. The patient is status post cholecystectomy. No unexpected abdominal calcifications are seen. Thoracolumbar scoliosis and multilevel spondylosis are seen. No acute bony abnormality. Remote sacral fracture noted. IMPRESSION: No acute abnormality. Right pubic ramus fractures as seen on the comparison examinations. Remote sacral fractures. Scoliosis and spondylosis. Electronically Signed   By: Inge Rise M.D.   On: 11/16/2015 09:43   Ct Head Wo Contrast  Result Date: 11/13/2015 CLINICAL DATA:  82 year old female with witnessed fall striking head. Hip and leg pain. Initial encounter. EXAM: CT HEAD WITHOUT CONTRAST CT CERVICAL SPINE WITHOUT CONTRAST TECHNIQUE: Multidetector CT imaging of the head and cervical spine was performed following the standard protocol without intravenous contrast. Multiplanar CT image reconstructions of the cervical spine were also generated. COMPARISON:  Head face and spine CT 10/21/2015. Cervical spine MRI 10/21/2015. FINDINGS: CT HEAD FINDINGS Visualized paranasal sinuses and mastoids are stable and well pneumatized. No scalp hematoma. Visualized orbit soft tissues are within normal limits. Osteopenia. Calvarium stable and intact. Calcified atherosclerosis at the skull base. Stable cerebral volume. Stable patchy bilateral cerebral white matter hypodensity. No midline shift, mass effect, or evidence of intracranial mass lesion. No ventriculomegaly. No acute intracranial hemorrhage identified. No cortically based acute infarct identified. No suspicious intracranial vascular hyperdensity. CT CERVICAL SPINE FINDINGS Osteopenia. Reversal of cervical lordosis re- demonstrated with stable mild anterolisthesis at C2-C3, C4-C5 and C7-T1. Visualized skull base is intact. No atlanto-occipital dissociation. Stable bilateral posterior element alignment. Widespread advanced cervical disc and endplate degeneration. Widespread advanced left side cervical facet degeneration. No cervical  spine fracture. Visible upper thoracic levels appear stable and intact. Stable visible lung apices with apical scarring. Calcified carotid atherosclerosis. Otherwise negative noncontrast neck soft tissues. IMPRESSION: 1. No acute intracranial abnormality and stable non contrast CT appearance of the brain. 2. No acute fracture or listhesis identified in the cervical spine. Ligamentous injury is not excluded. Electronically Signed   By: Genevie Ann M.D.   On: 11/13/2015 14:53   Ct Cervical Spine Wo Contrast  Result Date: 11/13/2015 CLINICAL DATA:  82 year old female with witnessed fall striking head. Hip and leg pain. Initial encounter. EXAM: CT HEAD WITHOUT CONTRAST CT CERVICAL SPINE WITHOUT CONTRAST TECHNIQUE: Multidetector CT imaging of the head and cervical spine was performed following the standard protocol without intravenous contrast. Multiplanar CT image reconstructions of the cervical spine were also generated. COMPARISON:  Head face and spine CT 10/21/2015. Cervical spine MRI 10/21/2015. FINDINGS: CT HEAD FINDINGS Visualized paranasal sinuses and mastoids are stable and well pneumatized. No scalp  hematoma. Visualized orbit soft tissues are within normal limits. Osteopenia. Calvarium stable and intact. Calcified atherosclerosis at the skull base. Stable cerebral volume. Stable patchy bilateral cerebral white matter hypodensity. No midline shift, mass effect, or evidence of intracranial mass lesion. No ventriculomegaly. No acute intracranial hemorrhage identified. No cortically based acute infarct identified. No suspicious intracranial vascular hyperdensity. CT CERVICAL SPINE FINDINGS Osteopenia. Reversal of cervical lordosis re- demonstrated with stable mild anterolisthesis at C2-C3, C4-C5 and C7-T1. Visualized skull base is intact. No atlanto-occipital dissociation. Stable bilateral posterior element alignment. Widespread advanced cervical disc and endplate degeneration. Widespread advanced left side cervical  facet degeneration. No cervical spine fracture. Visible upper thoracic levels appear stable and intact. Stable visible lung apices with apical scarring. Calcified carotid atherosclerosis. Otherwise negative noncontrast neck soft tissues. IMPRESSION: 1. No acute intracranial abnormality and stable non contrast CT appearance of the brain. 2. No acute fracture or listhesis identified in the cervical spine. Ligamentous injury is not excluded. Electronically Signed   By: Genevie Ann M.D.   On: 11/13/2015 14:53   Ct Pelvis Wo Contrast  Result Date: 11/13/2015 CLINICAL DATA:  82 year old female with witnessed fall. Bilateral hip pain and pain radiating distally in the legs. Unable to weightbear. Initial encounter. EXAM: CT PELVIS WITHOUT CONTRAST TECHNIQUE: Multidetector CT imaging of the pelvis was performed following the standard protocol without intravenous contrast. COMPARISON:  Right hip series 1414 hours today. Pelvis MRI 09/17/2015. FINDINGS: Bilateral sacral insufficiency fractures are present, as seen on the June MRI. There is associated sclerosis of the sacral ala. Elsewhere there is osteopenia. There is hematoma along the space of Retzius ventral to the urinary bladder and tracking to the left pelvic floor with associated intramuscular hematoma along the left pelvic sidewall. This is new since June and appears to be associated with a comminuted minimally displaced fracture of the left superior pubic ramus which extends into the symphysis (coronal image 28), and a more simple appearing oblique fracture of the left inferior pubic ramus (series 3, image 102). There was some pubic symphysis marrow edema in June. The left acetabulum and proximal femur appear intact. There is a superimposed subtle nondisplaced fracture of the right superior pubic ramus seen only on coronal image 36. This does not appear to extend into the symphysis. There is a chronic appearing minimally displaced right inferior pubic ramus fracture  with sclerosis. The right acetabulum and proximal right femur appear intact. Mild mass effect on the urinary bladder. No pelvic free fluid. Decompressed distal large and small bowel loops. Aortoiliac calcified atherosclerosis noted. Large 9.5 cm cystic mass located in the superior left pelvis appears to be arising from the left adnexa (on series 10, image 63) and has simple fluid densitometry. This is stable since June. No lymphadenopathy. IMPRESSION: 1. Partially comminuted minimally displaced fractures of the left superior and inferior pubic rami (the former extending into the pubic symphysis) are associated with mild posttraumatic hematoma along the left pelvic sidewall and pelvic floor. 2. Superimposed acute nondisplaced right superior pubic ramus fracture. 3. Superimposed subacute bilateral sacral insufficiency fractures (diagnosed by MRI in June). 4. Chronic right inferior pubic ramus fracture. 5. No acetabular or proximal femur fractures. 6. Large 9 cm left adnexal cystic mass again noted with consensus criteria recommending Pelvic Ultrasound for further characterization. 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 7.  Calcified aortic atherosclerosis. Electronically Signed   By: Genevie Ann M.D.   On: 11/13/2015 18:40   Dg Hip Unilat W Or Wo Pelvis 2-3 Views  Right  Result Date: 11/13/2015 CLINICAL DATA:  Pain following fall EXAM: DG HIP (WITH OR WITHOUT PELVIS) 2-3V RIGHT COMPARISON:  None. FINDINGS: Frontal pelvis as well as frontal and lateral right hip images were obtained. There is evidence of an old healed fracture of the left superior pubic ramus. There is a prior fracture of the medial left ischium, age uncertain but probably nonacute. There is no demonstrable acute fracture or dislocation. There is moderate symmetric narrowing of both hip joints. No erosive change. Bones are osteoporotic. IMPRESSION: Bones osteoporotic. Old healed fracture superior pubic ramus on the left. Age uncertain but  probable chronic fracture medial left ischium in anatomic alignment. No acute fracture or dislocation. Symmetric narrowing both hip joints. Electronically Signed   By: Lowella Grip III M.D.   On: 11/13/2015 14:38    Assessment/Plan 1. Dementia with behavioral disturbance Increase seroquel to 25 mg BID Check labs for b12, mag+ deficits, etc. Monitor for consistent BM's  2. Pelvic fracture, closed, initial encounter Monitor for pain/ pain control issues Continue to participate in physical and occupational therapy as tolerated Schedule Hydrocodone to 1 tablet TID and 1 tablet Q 4 hours prn  Family/ staff Communication:   Total Time: 25 minutes  Documentation: 15 minutes  Face to Face: 10 minutes  Family/Phone:   Labs/tests ordered:  Cbc, met c, tsh, b12, mag+  Medication list reviewed and assessed for continued appropriateness. Monthly medication orders reviewed and signed.  Vikki Ports, NP-C Geriatrics [facility] [phone], [place] [address] Cell Phone (Mon-Fri 8am-5pm):  [phone] On Call:  [phone] & follow prompts after 5pm & weekends Office Phone:  [phone] Office Fax:  [phone]

## 2015-11-30 ENCOUNTER — Non-Acute Institutional Stay (SKILLED_NURSING_FACILITY): Payer: Commercial Managed Care - HMO | Admitting: Gerontology

## 2015-11-30 DIAGNOSIS — E876 Hypokalemia: Secondary | ICD-10-CM | POA: Diagnosis not present

## 2015-11-30 DIAGNOSIS — R42 Dizziness and giddiness: Secondary | ICD-10-CM | POA: Diagnosis not present

## 2015-11-30 DIAGNOSIS — R52 Pain, unspecified: Secondary | ICD-10-CM | POA: Diagnosis not present

## 2015-11-30 DIAGNOSIS — E538 Deficiency of other specified B group vitamins: Secondary | ICD-10-CM

## 2015-11-30 DIAGNOSIS — R410 Disorientation, unspecified: Secondary | ICD-10-CM | POA: Diagnosis not present

## 2015-11-30 DIAGNOSIS — D649 Anemia, unspecified: Secondary | ICD-10-CM | POA: Diagnosis not present

## 2015-11-30 LAB — CBC WITH DIFFERENTIAL/PLATELET
BASOS ABS: 0.1 10*3/uL (ref 0–0.1)
BASOS PCT: 1 %
EOS PCT: 1 %
Eosinophils Absolute: 0.1 10*3/uL (ref 0–0.7)
HCT: 34.5 % — ABNORMAL LOW (ref 35.0–47.0)
Hemoglobin: 11.1 g/dL — ABNORMAL LOW (ref 12.0–16.0)
LYMPHS PCT: 18 %
Lymphs Abs: 1.6 10*3/uL (ref 1.0–3.6)
MCH: 19.8 pg — ABNORMAL LOW (ref 26.0–34.0)
MCHC: 32.1 g/dL (ref 32.0–36.0)
MCV: 61.6 fL — AB (ref 80.0–100.0)
MONO ABS: 0.5 10*3/uL (ref 0.2–0.9)
MONOS PCT: 5 %
Neutro Abs: 6.8 10*3/uL — ABNORMAL HIGH (ref 1.4–6.5)
Neutrophils Relative %: 75 %
PLATELETS: 370 10*3/uL (ref 150–440)
RBC: 5.6 MIL/uL — ABNORMAL HIGH (ref 3.80–5.20)
RDW: 17.7 % — AB (ref 11.5–14.5)
WBC: 9 10*3/uL (ref 3.6–11.0)

## 2015-11-30 LAB — VITAMIN B12: VITAMIN B 12: 297 pg/mL (ref 180–914)

## 2015-12-01 LAB — TSH: TSH: 4.102 u[IU]/mL (ref 0.350–4.500)

## 2015-12-01 LAB — COMPREHENSIVE METABOLIC PANEL
ALBUMIN: 3.2 g/dL — AB (ref 3.5–5.0)
ALK PHOS: 116 U/L (ref 38–126)
ALT: 13 U/L — ABNORMAL LOW (ref 14–54)
ANION GAP: 9 (ref 5–15)
AST: 21 U/L (ref 15–41)
BILIRUBIN TOTAL: 0.2 mg/dL — AB (ref 0.3–1.2)
BUN: 24 mg/dL — AB (ref 6–20)
CALCIUM: 9 mg/dL (ref 8.9–10.3)
CO2: 25 mmol/L (ref 22–32)
Chloride: 107 mmol/L (ref 101–111)
Creatinine, Ser: 0.99 mg/dL (ref 0.44–1.00)
GFR calc Af Amer: 59 mL/min — ABNORMAL LOW (ref >60)
GFR calc non Af Amer: 51 mL/min — ABNORMAL LOW (ref >60)
GLUCOSE: 101 mg/dL — AB (ref 65–99)
Potassium: 4 mmol/L (ref 3.5–5.1)
Sodium: 141 mmol/L (ref 135–145)
TOTAL PROTEIN: 6.2 g/dL — AB (ref 6.5–8.1)

## 2015-12-01 LAB — MAGNESIUM: Magnesium: 2.2 mg/dL (ref 1.7–2.4)

## 2015-12-04 DIAGNOSIS — E538 Deficiency of other specified B group vitamins: Secondary | ICD-10-CM | POA: Insufficient documentation

## 2015-12-04 DIAGNOSIS — R42 Dizziness and giddiness: Secondary | ICD-10-CM | POA: Insufficient documentation

## 2015-12-04 NOTE — Progress Notes (Signed)
Location:      Place of Service:  SNF (31) Provider:  Toni Arthurs, NP-C  Tracie Harrier, MD  Patient Care Team: Tracie Harrier, MD as PCP - General (Internal Medicine)  Extended Emergency Contact Information Primary Emergency Contact: Egelhoff,James C Address: [address], [place] [address] [place] Phone: [phone] Work Phone: [phone] Relation: Son Secondary Emergency Contact: [place] Mobile Phone: [phone] Relation: Son  Code Status:  full Goals of care: Advanced Directive information Advanced Directives 11/13/2015  Does patient have an advance directive? No  Type of Advance Directive Healthcare Power of Attorney  Does patient want to make changes to advanced directive? -  Copy of advanced directive(s) in chart? -  Would patient like information on creating an advanced directive? No - patient declined information     Chief Complaint  Patient presents with  . Acute Visit    HPI:  Pt is a 82 y.o. female seen today for an acute visit for dizziness, near syncope as well as posttraumatic pain in the pelvis and Vitamin B12 deficiency. Nursing concerned as pt is difficult to awaken today. When staff was attempting to move pt, she became very pale and complained of dizziness. Pt denies nausea, vomiting, chest pain, dyspnea, fever, chills, chest pain, headache. No other complaints. Pt is pleasantly confused making ROS limited.    Past Medical History:  Diagnosis Date  . Dementia   . Multiple pelvic fractures [facility])    Past Surgical History:  Procedure Laterality Date  . NO PAST SURGERIES      No Known Allergies    Medication List       Accurate as of 11/30/15 11:59 PM. Always use your most recent med list.          diclofenac sodium 1 % Gel Commonly known as:  VOLTAREN Apply 2 g topically 2 (two) times daily.   docusate sodium 100 MG capsule Commonly known as:  COLACE Take 1  capsule (100 mg total) by mouth 2 (two) times daily.   donepezil 10 MG tablet Commonly known as:  ARICEPT Take 10 mg by mouth daily.   HYDROcodone-acetaminophen 5-325 MG tablet Commonly known as:  NORCO/VICODIN Take 0.5 tablets by mouth every 6 (six) hours as needed for moderate pain or severe pain.   LORazepam 0.5 MG tablet Commonly known as:  ATIVAN Take 1 tablet (0.5 mg total) by mouth 2 (two) times daily.   LORazepam 1 MG tablet Commonly known as:  ATIVAN Take 1 tablet (1 mg total) by mouth 2 (two) times daily as needed for anxiety.   memantine 10 MG tablet Commonly known as:  NAMENDA Take 10 mg by mouth 2 (two) times daily.   polyethylene glycol packet Commonly known as:  MIRALAX / GLYCOLAX Take 17 g by mouth daily as needed for mild constipation.   QUEtiapine 25 MG tablet Commonly known as:  SEROQUEL Take 25 mg by mouth at bedtime.   sertraline 100 MG tablet Commonly known as:  ZOLOFT Take 100 mg by mouth daily.       Review of Systems  Unable to perform ROS: Dementia  Constitutional: Positive for activity change and diaphoresis. Negative for appetite change, chills and fever.  HENT: Negative for congestion, nosebleeds, rhinorrhea, sinus pressure, sneezing, sore throat, tinnitus, trouble swallowing and voice change.   Respiratory: Negative for apnea, cough, choking, chest tightness, shortness of breath and wheezing.  Cardiovascular: Negative for chest pain, palpitations and leg swelling.  Gastrointestinal: Negative for abdominal distention, abdominal pain, constipation, diarrhea and nausea.  Genitourinary: Negative for difficulty urinating, dysuria, frequency and urgency.  Musculoskeletal: Positive for arthralgias (typical arthritis), back pain and gait problem. Negative for myalgias.  Skin: Negative for color change, pallor, rash and wound.  Neurological: Positive for dizziness, syncope and weakness. Negative for tremors, speech difficulty, numbness and  headaches.  Psychiatric/Behavioral: Negative for agitation.  All other systems reviewed and are negative.    There is no immunization history on file for this patient. Pertinent  Health Maintenance Due  Topic Date Due  . DEXA SCAN  10/30/1997  . PNA vac Low Risk Adult (1 of 2 - PCV13) 10/30/1997  . INFLUENZA VACCINE  11/10/2015   No flowsheet data found. Functional Status Survey:    Vitals:   11/30/15 0700  BP: 124/83  Pulse: 77  Resp: 17  Temp: 97.3 F (36.3 C)  SpO2: 94%  Weight: 130 lb 8 oz (59.2 kg)   Body mass index is 22.4 kg/m. Physical Exam  Constitutional: Vital signs are normal. She appears well-developed and well-nourished. She is active and cooperative. She does not appear ill. No distress.  HENT:  Head: Normocephalic and atraumatic.  Mouth/Throat: Uvula is midline, oropharynx is clear and moist and mucous membranes are normal. Mucous membranes are not pale, not dry and not cyanotic.  Eyes: Conjunctivae, EOM and lids are normal. Pupils are equal, round, and reactive to light.  Neck: Trachea normal, normal range of motion and full passive range of motion without pain. Neck supple. No JVD present. No tracheal deviation, no edema and no erythema present. No thyromegaly present.  Cardiovascular: Normal rate, regular rhythm, normal heart sounds, intact distal pulses and normal pulses.  Exam reveals no gallop, no distant heart sounds and no friction rub.   No murmur heard. Pulmonary/Chest: Effort normal and breath sounds normal. No accessory muscle usage. No respiratory distress. She has no wheezes. She has no rales. She exhibits no tenderness.  Abdominal: Soft. Normal appearance and bowel sounds are normal. She exhibits no distension and no ascites. There is no tenderness.  Musculoskeletal: Normal range of motion. She exhibits no edema or tenderness.  Expected osteoarthritis, stiffness  Neurological: She is alert. She has normal strength.  Skin: Skin is warm, dry and  intact. No rash noted. She is not diaphoretic. No cyanosis or erythema. No pallor. Nails show no clubbing.  Psychiatric: She has a normal mood and affect. Her speech is normal and behavior is normal. Judgment and thought content normal. Cognition and memory are impaired. She exhibits abnormal recent memory and abnormal remote memory.  Nursing note and vitals reviewed.   Labs reviewed:  Recent Labs  11/13/15 1321 11/15/15 0435 11/16/15 0513 11/30/15 1530  NA 141 141  --  141  K 3.4* 3.3* 3.3* 4.0  CL 108 110  --  107  CO2 25 26  --  25  GLUCOSE 134* 88  --  101*  BUN 16 15  --  24*  CREATININE 0.84 0.72  --  0.99  CALCIUM 8.9 8.2*  --  9.0  MG  --  2.0  --  2.2    Recent Labs  11/30/15 1530  AST 21  ALT 13*  ALKPHOS 116  BILITOT 0.2*  PROT 6.2*  ALBUMIN 3.2*    Recent Labs  11/13/15 1321 11/15/15 0435 11/16/15 0513 11/30/15 1640  WBC 10.7 8.1  --  9.0  NEUTROABS  --   --   --  6.8*  HGB 11.3* 10.0* 10.3* 11.1*  HCT 35.2 31.9*  --  34.5*  MCV 61.0* 61.2*  --  61.6*  PLT 289 241  --  370   Lab Results  Component Value Date   TSH 4.102 11/30/2015   No results found for: HGBA1C No results found for: CHOL, HDL, LDLCALC, LDLDIRECT, TRIG, CHOLHDL  Significant Diagnostic Results in last 30 days:  Dg Chest 1 View  Result Date: 11/13/2015 CLINICAL DATA:  Pain following fall EXAM: CHEST 1 VIEW COMPARISON:  None. FINDINGS: There is no edema or consolidation. The heart size and pulmonary vascularity are normal. No adenopathy. There is atherosclerotic calcification in the aorta. No pneumothorax. No fracture evident. There is lower thoracic levoscoliosis. IMPRESSION: No edema or consolidation. No pneumothorax. Aortic atherosclerosis. Electronically Signed   By: Lowella Grip III M.D.   On: 11/13/2015 14:39   Dg Abd 1 View  Result Date: 11/16/2015 CLINICAL DATA:  Status post fall from a wheelchair. Pain. Initial encounter. EXAM: ABDOMEN - 1 VIEW COMPARISON:  CT abdomen  and pelvis 11/13/2015. MRI pelvis 09/17/2015 FINDINGS: The bowel gas pattern is unremarkable. The patient is status post cholecystectomy. No unexpected abdominal calcifications are seen. Thoracolumbar scoliosis and multilevel spondylosis are seen. No acute bony abnormality. Remote sacral fracture noted. IMPRESSION: No acute abnormality. Right pubic ramus fractures as seen on the comparison examinations. Remote sacral fractures. Scoliosis and spondylosis. Electronically Signed   By: Inge Rise M.D.   On: 11/16/2015 09:43   Ct Head Wo Contrast  Result Date: 11/13/2015 CLINICAL DATA:  82 year old female with witnessed fall striking head. Hip and leg pain. Initial encounter. EXAM: CT HEAD WITHOUT CONTRAST CT CERVICAL SPINE WITHOUT CONTRAST TECHNIQUE: Multidetector CT imaging of the head and cervical spine was performed following the standard protocol without intravenous contrast. Multiplanar CT image reconstructions of the cervical spine were also generated. COMPARISON:  Head face and spine CT 10/21/2015. Cervical spine MRI 10/21/2015. FINDINGS: CT HEAD FINDINGS Visualized paranasal sinuses and mastoids are stable and well pneumatized. No scalp hematoma. Visualized orbit soft tissues are within normal limits. Osteopenia. Calvarium stable and intact. Calcified atherosclerosis at the skull base. Stable cerebral volume. Stable patchy bilateral cerebral white matter hypodensity. No midline shift, mass effect, or evidence of intracranial mass lesion. No ventriculomegaly. No acute intracranial hemorrhage identified. No cortically based acute infarct identified. No suspicious intracranial vascular hyperdensity. CT CERVICAL SPINE FINDINGS Osteopenia. Reversal of cervical lordosis re- demonstrated with stable mild anterolisthesis at C2-C3, C4-C5 and C7-T1. Visualized skull base is intact. No atlanto-occipital dissociation. Stable bilateral posterior element alignment. Widespread advanced cervical disc and endplate  degeneration. Widespread advanced left side cervical facet degeneration. No cervical spine fracture. Visible upper thoracic levels appear stable and intact. Stable visible lung apices with apical scarring. Calcified carotid atherosclerosis. Otherwise negative noncontrast neck soft tissues. IMPRESSION: 1. No acute intracranial abnormality and stable non contrast CT appearance of the brain. 2. No acute fracture or listhesis identified in the cervical spine. Ligamentous injury is not excluded. Electronically Signed   By: Genevie Ann M.D.   On: 11/13/2015 14:53   Ct Cervical Spine Wo Contrast  Result Date: 11/13/2015 CLINICAL DATA:  82 year old female with witnessed fall striking head. Hip and leg pain. Initial encounter. EXAM: CT HEAD WITHOUT CONTRAST CT CERVICAL SPINE WITHOUT CONTRAST TECHNIQUE: Multidetector CT imaging of the head and cervical spine was performed following the standard protocol without intravenous contrast. Multiplanar CT image reconstructions of  the cervical spine were also generated. COMPARISON:  Head face and spine CT 10/21/2015. Cervical spine MRI 10/21/2015. FINDINGS: CT HEAD FINDINGS Visualized paranasal sinuses and mastoids are stable and well pneumatized. No scalp hematoma. Visualized orbit soft tissues are within normal limits. Osteopenia. Calvarium stable and intact. Calcified atherosclerosis at the skull base. Stable cerebral volume. Stable patchy bilateral cerebral white matter hypodensity. No midline shift, mass effect, or evidence of intracranial mass lesion. No ventriculomegaly. No acute intracranial hemorrhage identified. No cortically based acute infarct identified. No suspicious intracranial vascular hyperdensity. CT CERVICAL SPINE FINDINGS Osteopenia. Reversal of cervical lordosis re- demonstrated with stable mild anterolisthesis at C2-C3, C4-C5 and C7-T1. Visualized skull base is intact. No atlanto-occipital dissociation. Stable bilateral posterior element alignment. Widespread  advanced cervical disc and endplate degeneration. Widespread advanced left side cervical facet degeneration. No cervical spine fracture. Visible upper thoracic levels appear stable and intact. Stable visible lung apices with apical scarring. Calcified carotid atherosclerosis. Otherwise negative noncontrast neck soft tissues. IMPRESSION: 1. No acute intracranial abnormality and stable non contrast CT appearance of the brain. 2. No acute fracture or listhesis identified in the cervical spine. Ligamentous injury is not excluded. Electronically Signed   By: Genevie Ann M.D.   On: 11/13/2015 14:53   Ct Pelvis Wo Contrast  Result Date: 11/13/2015 CLINICAL DATA:  82 year old female with witnessed fall. Bilateral hip pain and pain radiating distally in the legs. Unable to weightbear. Initial encounter. EXAM: CT PELVIS WITHOUT CONTRAST TECHNIQUE: Multidetector CT imaging of the pelvis was performed following the standard protocol without intravenous contrast. COMPARISON:  Right hip series 1414 hours today. Pelvis MRI 09/17/2015. FINDINGS: Bilateral sacral insufficiency fractures are present, as seen on the June MRI. There is associated sclerosis of the sacral ala. Elsewhere there is osteopenia. There is hematoma along the space of Retzius ventral to the urinary bladder and tracking to the left pelvic floor with associated intramuscular hematoma along the left pelvic sidewall. This is new since June and appears to be associated with a comminuted minimally displaced fracture of the left superior pubic ramus which extends into the symphysis (coronal image 28), and a more simple appearing oblique fracture of the left inferior pubic ramus (series 3, image 102). There was some pubic symphysis marrow edema in June. The left acetabulum and proximal femur appear intact. There is a superimposed subtle nondisplaced fracture of the right superior pubic ramus seen only on coronal image 36. This does not appear to extend into the  symphysis. There is a chronic appearing minimally displaced right inferior pubic ramus fracture with sclerosis. The right acetabulum and proximal right femur appear intact. Mild mass effect on the urinary bladder. No pelvic free fluid. Decompressed distal large and small bowel loops. Aortoiliac calcified atherosclerosis noted. Large 9.5 cm cystic mass located in the superior left pelvis appears to be arising from the left adnexa (on series 10, image 63) and has simple fluid densitometry. This is stable since June. No lymphadenopathy. IMPRESSION: 1. Partially comminuted minimally displaced fractures of the left superior and inferior pubic rami (the former extending into the pubic symphysis) are associated with mild posttraumatic hematoma along the left pelvic sidewall and pelvic floor. 2. Superimposed acute nondisplaced right superior pubic ramus fracture. 3. Superimposed subacute bilateral sacral insufficiency fractures (diagnosed by MRI in June). 4. Chronic right inferior pubic ramus fracture. 5. No acetabular or proximal femur fractures. 6. Large 9 cm left adnexal cystic mass again noted with consensus criteria recommending Pelvic Ultrasound for further characterization. Favor a low  malignant potential ovarian neoplasm. 7.  Calcified aortic atherosclerosis. Electronically Signed   By: Genevie Ann M.D.   On: 11/13/2015 18:40   Dg Hip Unilat W Or Wo Pelvis 2-3 Views Right  Result Date: 11/13/2015 CLINICAL DATA:  Pain following fall EXAM: DG HIP (WITH OR WITHOUT PELVIS) 2-3V RIGHT COMPARISON:  None. FINDINGS: Frontal pelvis as well as frontal and lateral right hip images were obtained. There is evidence of an old healed fracture of the left superior pubic ramus. There is a prior fracture of the medial left ischium, age uncertain but probably nonacute. There is no demonstrable acute fracture or dislocation. There is moderate symmetric narrowing of both hip joints. No erosive change. Bones are osteoporotic. IMPRESSION:  Bones osteoporotic. Old healed fracture superior pubic ramus on the left. Age uncertain but probable chronic fracture medial left ischium in anatomic alignment. No acute fracture or dislocation. Symmetric narrowing both hip joints. Electronically Signed   By: Lowella Grip III M.D.   On: 11/13/2015 14:38    Assessment/Plan 1. Dizziness  Decrease seroquel to 25 mg po Q HS  Increase PO fluid intake  Check labs- assess for anemia, AKI, etc.   2. Posttraumatic pain  Continue Hydrocodone/APAP 5/325 1 tablet PO TID. Hold for sedation  Continue Hydrocodone/APAP 5/325 1 tablet PO Q 4 hours prn RX #120; Refills-0  3. Vitamin B12 deficiency  Cyanocobalamin 2,000 mcg po Q Day x 14 days, then   Cyanocobalamin 1,000 mcg po Q Day  Family/ staff Communication:   Total Time: 30 minutes  Documentation: 15 minutes  Face to Face: 15 minutes  Family/Phone:   Labs/tests ordered:  Cbc, met c, tsh  Medication list reviewed and assessed for continued appropriateness.  Vikki Ports, NP-C Geriatrics [facility] [phone], [place] [address] Cell Phone (Mon-Fri 8am-5pm):  [phone] On Call:  [phone] & follow prompts after 5pm & weekends Office Phone:  [phone] Office Fax:  [phone]

## 2015-12-07 ENCOUNTER — Ambulatory Visit
Admission: RE | Admit: 2015-12-07 | Discharge: 2015-12-07 | Disposition: A | Discharge status: Home / Self Care | Payer: Commercial Managed Care - HMO | Source: Ambulatory Visit | Attending: Internal Medicine | Admitting: Internal Medicine

## 2015-12-07 ENCOUNTER — Non-Acute Institutional Stay (SKILLED_NURSING_FACILITY): Payer: Commercial Managed Care - HMO | Admitting: Gerontology

## 2015-12-07 DIAGNOSIS — R42 Dizziness and giddiness: Secondary | ICD-10-CM | POA: Diagnosis not present

## 2015-12-07 DIAGNOSIS — R1909 Other intra-abdominal and pelvic swelling, mass and lump: Secondary | ICD-10-CM | POA: Diagnosis not present

## 2015-12-07 DIAGNOSIS — F0391 Unspecified dementia with behavioral disturbance: Secondary | ICD-10-CM

## 2015-12-07 DIAGNOSIS — R102 Pelvic and perineal pain: Secondary | ICD-10-CM

## 2015-12-07 DIAGNOSIS — I700 Atherosclerosis of aorta: Secondary | ICD-10-CM | POA: Insufficient documentation

## 2015-12-07 DIAGNOSIS — E538 Deficiency of other specified B group vitamins: Secondary | ICD-10-CM | POA: Diagnosis not present

## 2015-12-07 DIAGNOSIS — S329XXD Fracture of unspecified parts of lumbosacral spine and pelvis, subsequent encounter for fracture with routine healing: Secondary | ICD-10-CM

## 2015-12-07 DIAGNOSIS — I77811 Abdominal aortic ectasia: Secondary | ICD-10-CM

## 2015-12-07 DIAGNOSIS — R52 Pain, unspecified: Secondary | ICD-10-CM | POA: Diagnosis not present

## 2015-12-07 DIAGNOSIS — I714 Abdominal aortic aneurysm, without rupture: Secondary | ICD-10-CM | POA: Diagnosis not present

## 2015-12-07 DIAGNOSIS — N83202 Unspecified ovarian cyst, left side: Secondary | ICD-10-CM | POA: Diagnosis not present

## 2015-12-07 DIAGNOSIS — F03918 Unspecified dementia, unspecified severity, with other behavioral disturbance: Secondary | ICD-10-CM

## 2015-12-08 DIAGNOSIS — F3341 Major depressive disorder, recurrent, in partial remission: Secondary | ICD-10-CM | POA: Diagnosis not present

## 2015-12-08 DIAGNOSIS — F3289 Other specified depressive episodes: Secondary | ICD-10-CM | POA: Diagnosis not present

## 2015-12-08 DIAGNOSIS — F0150 Vascular dementia without behavioral disturbance: Secondary | ICD-10-CM | POA: Diagnosis not present

## 2015-12-08 DIAGNOSIS — R410 Disorientation, unspecified: Secondary | ICD-10-CM | POA: Diagnosis not present

## 2015-12-08 DIAGNOSIS — M150 Primary generalized (osteo)arthritis: Secondary | ICD-10-CM | POA: Diagnosis not present

## 2015-12-08 DIAGNOSIS — Z09 Encounter for follow-up examination after completed treatment for conditions other than malignant neoplasm: Secondary | ICD-10-CM | POA: Diagnosis not present

## 2015-12-08 DIAGNOSIS — D649 Anemia, unspecified: Secondary | ICD-10-CM | POA: Diagnosis not present

## 2015-12-08 DIAGNOSIS — S32810A Multiple fractures of pelvis with stable disruption of pelvic ring, initial encounter for closed fracture: Secondary | ICD-10-CM | POA: Diagnosis not present

## 2015-12-08 DIAGNOSIS — E538 Deficiency of other specified B group vitamins: Secondary | ICD-10-CM | POA: Diagnosis not present

## 2015-12-08 DIAGNOSIS — E876 Hypokalemia: Secondary | ICD-10-CM | POA: Diagnosis not present

## 2015-12-08 DIAGNOSIS — R296 Repeated falls: Secondary | ICD-10-CM | POA: Diagnosis not present

## 2015-12-08 LAB — URINALYSIS COMPLETE WITH MICROSCOPIC (ARMC ONLY)
Bilirubin Urine: NEGATIVE
Glucose, UA: NEGATIVE mg/dL
Hgb urine dipstick: NEGATIVE
KETONES UR: NEGATIVE mg/dL
NITRITE: NEGATIVE
PH: 6 (ref 5.0–8.0)
PROTEIN: NEGATIVE mg/dL
SPECIFIC GRAVITY, URINE: 1.017 (ref 1.005–1.030)

## 2015-12-08 NOTE — Progress Notes (Signed)
Location:      Place of Service:  SNF (31) Provider:  Lorenso Quarry, NP-C  Barbette Reichmann, MD  Patient Care Team: Barbette Reichmann, MD as PCP - General (Internal Medicine)  Extended Emergency Contact Information Primary Emergency Contact: Siler,James C Address: [address]          [place], [place] [address] [place] Home Phone: [phone] Work Phone: [phone] Relation: Son Secondary Emergency Contact: Terance Ice [place] Mobile Phone: [phone] Relation: Son  Code Status:  full Goals of care: Advanced Directive information Advanced Directives 11/13/2015  Does patient have an advance directive? No  Type of Advance Directive Healthcare Power of Attorney  Does patient want to make changes to advanced directive? -  Copy of advanced directive(s) in chart? -  Would patient like information on creating an advanced directive? No - patient declined information     Chief Complaint  Patient presents with  . Discharge Note    HPI:  Pt is a 82 y.o. female seen today for discharge from facility for visit for dizziness, near syncope as well as posttraumatic pain in the pelvis and Vitamin B12 deficiency She is at the facility for rehab following pelvic fracture.  She had been confused, trying to get out of the chair by herself and uncooperative at times.  She has dementia and is unable to accurately report her pain level.  Last week, she was put on scheduled hydrocodone and she now appears to be less confused and calmer. Ortho decreased her Hydrocodone dose for discharge.  She reports the pelvic pain as tolerable.  Vital signs are stable.  Weight is staying stable.  No other complaints.  Pt denies nausea, vomiting, chest pain, dyspnea, fever, chills, chest pain, headache. No other complaints. Pt is pleasantly confused making ROS limited.    Past Medical History:  Diagnosis Date  . Dementia   . Multiple pelvic fractures [facility])    Past Surgical  History:  Procedure Laterality Date  . NO PAST SURGERIES      No Known Allergies    Medication List       Accurate as of 12/07/15 11:59 PM. Always use your most recent med list.          diclofenac sodium 1 % Gel Commonly known as:  VOLTAREN Apply 2 g topically 2 (two) times daily.   docusate sodium 100 MG capsule Commonly known as:  COLACE Take 1 capsule (100 mg total) by mouth 2 (two) times daily.   donepezil 10 MG tablet Commonly known as:  ARICEPT Take 10 mg by mouth daily.   HYDROcodone-acetaminophen 5-325 MG tablet Commonly known as:  NORCO/VICODIN Take 0.5 tablets by mouth every 6 (six) hours as needed for moderate pain or severe pain.   LORazepam 0.5 MG tablet Commonly known as:  ATIVAN Take 1 tablet (0.5 mg total) by mouth 2 (two) times daily.   LORazepam 1 MG tablet Commonly known as:  ATIVAN Take 1 tablet (1 mg total) by mouth 2 (two) times daily as needed for anxiety.   memantine 10 MG tablet Commonly known as:  NAMENDA Take 10 mg by mouth 2 (two) times daily.   polyethylene glycol packet Commonly known as:  MIRALAX / GLYCOLAX Take 17 g by mouth daily as needed for mild constipation.   QUEtiapine 25 MG tablet Commonly known as:  SEROQUEL Take 25 mg by mouth at bedtime.   sertraline 100 MG tablet Commonly known as:  ZOLOFT Take 100 mg  by mouth daily.       Review of Systems  Unable to perform ROS: Dementia  Constitutional: Positive for activity change and diaphoresis. Negative for appetite change, chills and fever.  HENT: Negative for congestion, nosebleeds, rhinorrhea, sinus pressure, sneezing, sore throat, tinnitus, trouble swallowing and voice change.   Respiratory: Negative for apnea, cough, choking, chest tightness, shortness of breath and wheezing.   Cardiovascular: Negative for chest pain, palpitations and leg swelling.  Gastrointestinal: Negative for abdominal distention, abdominal pain, constipation, diarrhea and nausea.    Genitourinary: Negative for difficulty urinating, dysuria, frequency and urgency.  Musculoskeletal: Positive for arthralgias (typical arthritis), back pain and gait problem. Negative for myalgias.  Skin: Negative for color change, pallor, rash and wound.  Neurological: Positive for dizziness, syncope and weakness. Negative for tremors, speech difficulty, numbness and headaches.  Psychiatric/Behavioral: Negative for agitation.  All other systems reviewed and are negative.    There is no immunization history on file for this patient. Pertinent  Health Maintenance Due  Topic Date Due  . DEXA SCAN  10/30/1997  . PNA vac Low Risk Adult (1 of 2 - PCV13) 10/30/1997  . INFLUENZA VACCINE  11/10/2015   No flowsheet data found. Functional Status Survey:    Vitals:   12/07/15 0500  BP: 136/74  Pulse: 61  Resp: 17  Temp: 97.8 F (36.6 C)  SpO2: 94%  Weight: 129 lb 14.4 oz (58.9 kg)   Body mass index is 22.3 kg/m. Physical Exam  Constitutional: Vital signs are normal. She appears well-developed and well-nourished. She is active and cooperative. She does not appear ill. No distress.  HENT:  Head: Normocephalic and atraumatic.  Mouth/Throat: Uvula is midline, oropharynx is clear and moist and mucous membranes are normal. Mucous membranes are not pale, not dry and not cyanotic.  Eyes: Conjunctivae, EOM and lids are normal. Pupils are equal, round, and reactive to light.  Neck: Trachea normal, normal range of motion and full passive range of motion without pain. Neck supple. No JVD present. No tracheal deviation, no edema and no erythema present. No thyromegaly present.  Cardiovascular: Normal rate, regular rhythm, normal heart sounds, intact distal pulses and normal pulses.  Exam reveals no gallop, no distant heart sounds and no friction rub.   No murmur heard. Pulmonary/Chest: Effort normal and breath sounds normal. No accessory muscle usage. No respiratory distress. She has no wheezes.  She has no rales. She exhibits no tenderness.  Abdominal: Soft. Normal appearance and bowel sounds are normal. She exhibits no distension and no ascites. There is no tenderness.  Musculoskeletal: Normal range of motion. She exhibits no edema or tenderness.  Expected osteoarthritis, stiffness  Neurological: She is alert. She has normal strength.  Skin: Skin is warm, dry and intact. No rash noted. She is not diaphoretic. No cyanosis or erythema. No pallor. Nails show no clubbing.  Psychiatric: She has a normal mood and affect. Her speech is normal and behavior is normal. Judgment and thought content normal. Cognition and memory are impaired. She exhibits abnormal recent memory and abnormal remote memory.  Nursing note and vitals reviewed.   Labs reviewed:  Recent Labs  11/13/15 1321 11/15/15 0435 11/16/15 0513 11/30/15 1530  NA 141 141  --  141  K 3.4* 3.3* 3.3* 4.0  CL 108 110  --  107  CO2 25 26  --  25  GLUCOSE 134* 88  --  101*  BUN 16 15  --  24*  CREATININE 0.84 0.72  --  0.99  CALCIUM 8.9 8.2*  --  9.0  MG  --  2.0  --  2.2    Recent Labs  11/30/15 1530  AST 21  ALT 13*  ALKPHOS 116  BILITOT 0.2*  PROT 6.2*  ALBUMIN 3.2*    Recent Labs  11/13/15 1321 11/15/15 0435 11/16/15 0513 11/30/15 1640  WBC 10.7 8.1  --  9.0  NEUTROABS  --   --   --  6.8*  HGB 11.3* 10.0* 10.3* 11.1*  HCT 35.2 31.9*  --  34.5*  MCV 61.0* 61.2*  --  61.6*  PLT 289 241  --  370   Lab Results  Component Value Date   TSH 4.102 11/30/2015   No results found for: HGBA1C No results found for: CHOL, HDL, LDLCALC, LDLDIRECT, TRIG, CHOLHDL  Significant Diagnostic Results in last 30 days:  Dg Chest 1 View  Result Date: 11/13/2015 CLINICAL DATA:  Pain following fall EXAM: CHEST 1 VIEW COMPARISON:  None. FINDINGS: There is no edema or consolidation. The heart size and pulmonary vascularity are normal. No adenopathy. There is atherosclerotic calcification in the aorta. No pneumothorax. No  fracture evident. There is lower thoracic levoscoliosis. IMPRESSION: No edema or consolidation. No pneumothorax. Aortic atherosclerosis. Electronically Signed   By: Bretta Bang III M.D.   On: 11/13/2015 14:39   Dg Abd 1 View  Result Date: 11/16/2015 CLINICAL DATA:  Status post fall from a wheelchair. Pain. Initial encounter. EXAM: ABDOMEN - 1 VIEW COMPARISON:  CT abdomen and pelvis 11/13/2015. MRI pelvis 09/17/2015 FINDINGS: The bowel gas pattern is unremarkable. The patient is status post cholecystectomy. No unexpected abdominal calcifications are seen. Thoracolumbar scoliosis and multilevel spondylosis are seen. No acute bony abnormality. Remote sacral fracture noted. IMPRESSION: No acute abnormality. Right pubic ramus fractures as seen on the comparison examinations. Remote sacral fractures. Scoliosis and spondylosis. Electronically Signed   By: Drusilla Kanner M.D.   On: 11/16/2015 09:43   Ct Head Wo Contrast  Result Date: 11/13/2015 CLINICAL DATA:  82 year old female with witnessed fall striking head. Hip and leg pain. Initial encounter. EXAM: CT HEAD WITHOUT CONTRAST CT CERVICAL SPINE WITHOUT CONTRAST TECHNIQUE: Multidetector CT imaging of the head and cervical spine was performed following the standard protocol without intravenous contrast. Multiplanar CT image reconstructions of the cervical spine were also generated. COMPARISON:  Head face and spine CT 10/21/2015. Cervical spine MRI 10/21/2015. FINDINGS: CT HEAD FINDINGS Visualized paranasal sinuses and mastoids are stable and well pneumatized. No scalp hematoma. Visualized orbit soft tissues are within normal limits. Osteopenia. Calvarium stable and intact. Calcified atherosclerosis at the skull base. Stable cerebral volume. Stable patchy bilateral cerebral white matter hypodensity. No midline shift, mass effect, or evidence of intracranial mass lesion. No ventriculomegaly. No acute intracranial hemorrhage identified. No cortically based acute  infarct identified. No suspicious intracranial vascular hyperdensity. CT CERVICAL SPINE FINDINGS Osteopenia. Reversal of cervical lordosis re- demonstrated with stable mild anterolisthesis at C2-C3, C4-C5 and C7-T1. Visualized skull base is intact. No atlanto-occipital dissociation. Stable bilateral posterior element alignment. Widespread advanced cervical disc and endplate degeneration. Widespread advanced left side cervical facet degeneration. No cervical spine fracture. Visible upper thoracic levels appear stable and intact. Stable visible lung apices with apical scarring. Calcified carotid atherosclerosis. Otherwise negative noncontrast neck soft tissues. IMPRESSION: 1. No acute intracranial abnormality and stable non contrast CT appearance of the brain. 2. No acute fracture or listhesis identified in the cervical spine. Ligamentous injury is not excluded. Electronically Signed   By: Rexene Edison  Margo Aye M.D.   On: 11/13/2015 14:53   Ct Cervical Spine Wo Contrast  Result Date: 11/13/2015 CLINICAL DATA:  82 year old female with witnessed fall striking head. Hip and leg pain. Initial encounter. EXAM: CT HEAD WITHOUT CONTRAST CT CERVICAL SPINE WITHOUT CONTRAST TECHNIQUE: Multidetector CT imaging of the head and cervical spine was performed following the standard protocol without intravenous contrast. Multiplanar CT image reconstructions of the cervical spine were also generated. COMPARISON:  Head face and spine CT 10/21/2015. Cervical spine MRI 10/21/2015. FINDINGS: CT HEAD FINDINGS Visualized paranasal sinuses and mastoids are stable and well pneumatized. No scalp hematoma. Visualized orbit soft tissues are within normal limits. Osteopenia. Calvarium stable and intact. Calcified atherosclerosis at the skull base. Stable cerebral volume. Stable patchy bilateral cerebral white matter hypodensity. No midline shift, mass effect, or evidence of intracranial mass lesion. No ventriculomegaly. No acute intracranial hemorrhage  identified. No cortically based acute infarct identified. No suspicious intracranial vascular hyperdensity. CT CERVICAL SPINE FINDINGS Osteopenia. Reversal of cervical lordosis re- demonstrated with stable mild anterolisthesis at C2-C3, C4-C5 and C7-T1. Visualized skull base is intact. No atlanto-occipital dissociation. Stable bilateral posterior element alignment. Widespread advanced cervical disc and endplate degeneration. Widespread advanced left side cervical facet degeneration. No cervical spine fracture. Visible upper thoracic levels appear stable and intact. Stable visible lung apices with apical scarring. Calcified carotid atherosclerosis. Otherwise negative noncontrast neck soft tissues. IMPRESSION: 1. No acute intracranial abnormality and stable non contrast CT appearance of the brain. 2. No acute fracture or listhesis identified in the cervical spine. Ligamentous injury is not excluded. Electronically Signed   By: Odessa Fleming M.D.   On: 11/13/2015 14:53   Ct Pelvis Wo Contrast  Result Date: 11/13/2015 CLINICAL DATA:  82 year old female with witnessed fall. Bilateral hip pain and pain radiating distally in the legs. Unable to weightbear. Initial encounter. EXAM: CT PELVIS WITHOUT CONTRAST TECHNIQUE: Multidetector CT imaging of the pelvis was performed following the standard protocol without intravenous contrast. COMPARISON:  Right hip series 1414 hours today. Pelvis MRI 09/17/2015. FINDINGS: Bilateral sacral insufficiency fractures are present, as seen on the June MRI. There is associated sclerosis of the sacral ala. Elsewhere there is osteopenia. There is hematoma along the space of Retzius ventral to the urinary bladder and tracking to the left pelvic floor with associated intramuscular hematoma along the left pelvic sidewall. This is new since June and appears to be associated with a comminuted minimally displaced fracture of the left superior pubic ramus which extends into the symphysis (coronal image  28), and a more simple appearing oblique fracture of the left inferior pubic ramus (series 3, image 102). There was some pubic symphysis marrow edema in June. The left acetabulum and proximal femur appear intact. There is a superimposed subtle nondisplaced fracture of the right superior pubic ramus seen only on coronal image 36. This does not appear to extend into the symphysis. There is a chronic appearing minimally displaced right inferior pubic ramus fracture with sclerosis. The right acetabulum and proximal right femur appear intact. Mild mass effect on the urinary bladder. No pelvic free fluid. Decompressed distal large and small bowel loops. Aortoiliac calcified atherosclerosis noted. Large 9.5 cm cystic mass located in the superior left pelvis appears to be arising from the left adnexa (on series 10, image 63) and has simple fluid densitometry. This is stable since June. No lymphadenopathy. IMPRESSION: 1. Partially comminuted minimally displaced fractures of the left superior and inferior pubic rami (the former extending into the pubic symphysis) are associated with  mild posttraumatic hematoma along the left pelvic sidewall and pelvic floor. 2. Superimposed acute nondisplaced right superior pubic ramus fracture. 3. Superimposed subacute bilateral sacral insufficiency fractures (diagnosed by MRI in June). 4. Chronic right inferior pubic ramus fracture. 5. No acetabular or proximal femur fractures. 6. Large 9 cm left adnexal cystic mass again noted with consensus criteria recommending Pelvic Ultrasound for further characterization. 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 7.  Calcified aortic atherosclerosis. Electronically Signed   By: Odessa Fleming M.D.   On: 11/13/2015 18:40   US Pelvis Complete  Result Date: 12/07/2015 CLINICAL DATA:  Left adnexal cystic lesion seen on previous CT and MRI. Postmenopausal female. Dementia. EXAM: TRANSABDOMINAL ULTRASOUND OF PELVIS TECHNIQUE: Transabdominal ultrasound  examination of the pelvis was performed including evaluation of the uterus, ovaries, adnexal regions, and pelvic cul-de-sac. Transvaginal sonography was not performed due to patient's dementia and inability to cooperate. COMPARISON:  CT on 11/13/2015 and MRI on 09/17/2015 FINDINGS: Uterus Measurements: 3.1 x 1.5 x 2.6 cm. Limited visualization by transabdominal sonography, however definite fibroids identified. Endometrium Thickness: Not visualized by transabdominal sonography. Right ovary Measurements: Not directly visualized, however no adnexal mass identified. Left ovary Measurements: A cystic lesion is seen in the left adnexa which measures 8.1 x 8.4 x 6.0 cm. This lesion appears to show subtle areas of mural nodularity, without evidence of internal blood flow on color Doppler ultrasound. No internal septations or other complex features identified. This is similar in size in appearance compared to recent CT and MRI. Other findings:  No abnormal free fluid. IMPRESSION: 8.4 cm cystic lesion in left adnexa with subtle mural nodularity. This has indeterminate but probably benign characteristics. Benign or low malignant potential cystic ovarian neoplasm is suspected in a postmenopausal female. Surgical evaluation should be considered. Electronically Signed   By: Myles Rosenthal M.D.   On: 12/07/2015 11:34   Dg Hip Unilat W Or Wo Pelvis 2-3 Views Right  Result Date: 11/13/2015 CLINICAL DATA:  Pain following fall EXAM: DG HIP (WITH OR WITHOUT PELVIS) 2-3V RIGHT COMPARISON:  None. FINDINGS: Frontal pelvis as well as frontal and lateral right hip images were obtained. There is evidence of an old healed fracture of the left superior pubic ramus. There is a prior fracture of the medial left ischium, age uncertain but probably nonacute. There is no demonstrable acute fracture or dislocation. There is moderate symmetric narrowing of both hip joints. No erosive change. Bones are osteoporotic. IMPRESSION: Bones osteoporotic. Old  healed fracture superior pubic ramus on the left. Age uncertain but probable chronic fracture medial left ischium in anatomic alignment. No acute fracture or dislocation. Symmetric narrowing both hip joints. Electronically Signed   By: Bretta Bang III M.D.   On: 11/13/2015 14:38   [place] Retroperitoneal Comp  Result Date: 12/07/2015 CLINICAL DATA:  82 year old female with ectatic abdominal aorta. EXAM: ULTRASOUND RETROPERITONEAL COMPLETE TECHNIQUE: Ultrasound examination of the abdominal aorta was performed to evaluate for abdominal aortic aneurysm. The common iliac arteries, IVC, and kidneys were also evaluated. COMPARISON:  CT scan of the pelvis 11/13/2015 ; MRI lumbar spine 09/17/2015 FINDINGS: Abdominal Aorta Ectatic bordering on aneurysmal infrarenal abdominal aorta. Heterogeneous atherosclerotic plaque. Maximum AP Diameter:  2.9 cm Maximum TRV Diameter: 3.1 cm Right Common Iliac Artery No aneurysm identified. Left Common Iliac Artery No aneurysm identified. IVC No abnormality visualized. Right Kidney Length: 8.6 cm Echogenicity within normal limits. No mass or hydronephrosis visualized. Left Kidney Length: 8.7 cm Echogenicity within normal limits. No mass or hydronephrosis visualized.  IMPRESSION: 1. Mildly aneurysmal infrarenal abdominal aorta with a maximal diameter of 2.9 x 3.1 cm. Recommend followup by ultrasound in 3 years. This recommendation follows ACR consensus guidelines: White Paper of the ACR Incidental Findings Committee II on Vascular Findings. J Am Coll Radiol 2013; 16:109-604 2.  Aortic aneurysm NOS (ICD10-I71.9) 3.  Aortic Atherosclerosis (ICD10-170.0) Signed, Sterling Big, MD [facility] Electronically Signed   By: Malachy Moan M.D.   On: 12/07/2015 11:40    Assessment/Plan 1. Dizziness  Decrease seroquel to 25 mg po Q HS  Increase PO fluid intake  Check labs- assess for anemia, AKI, etc.   2.  Posttraumatic pain  Continue Hydrocodone/APAP 5/325 1/2 tablet PO Q 6 hours prn RX #15; Refills-0, per Orthopedist  3. Vitamin B12 deficiency  Cyanocobalamin 2,000 mcg po Q Day x 5 more days, then   Cyanocobalamin 1,000 mcg po Q Day  4. Pelvic Fracture, closed, with routine healing, subsequent encounter  Continue pt/ot  Monitor closely for assistance- fall risk  5. Dementia with behavioral disturbance  Continue seroquel 25 mg po Q day  Monitor closely for uncontrolled pain- leading to behavioral disturbances  Continue vitamin B12 as listed above  Continue namenda 10 mg po bid  Continue aricept 10 mg po Q HS  Continue lorazepam 0.5 mg po BID scheduled and prn  Continue sertraline 100 mg po Q day  Family/ staff Communication:   Total Time:   Documentation:   Face to Face:   Family/Phone:   Labs/tests ordered:  Per pcp  Medication list reviewed and assessed for continued appropriateness.  Patient is being discharged with the following home health services: pt/ot   Patient is being discharged with the following durable medical equipment:     Patient has been advised to f/u with their PCP in 1-2 weeks to bring them up to date on their rehab stay.  Social services at facility was responsible for arranging this appointment.  Pt was provided with a 30 day supply of prescriptions for medications and refills must be obtained from their PCP.  For controlled substances, a more limited supply may be provided adequate until PCP appointment only.  Brynda Rim, NP-C Geriatrics [facility] [phone], [place] [address] Cell Phone (Mon-Fri 8am-5pm):  [phone] On Call:  [phone] & follow prompts after 5pm & weekends Office Phone:  [phone] Office Fax:  [phone]

## 2015-12-12 LAB — URINE CULTURE

## 2015-12-21 DIAGNOSIS — F39 Unspecified mood [affective] disorder: Secondary | ICD-10-CM | POA: Diagnosis not present

## 2015-12-21 DIAGNOSIS — F0390 Unspecified dementia without behavioral disturbance: Secondary | ICD-10-CM | POA: Diagnosis not present

## 2015-12-21 DIAGNOSIS — F333 Major depressive disorder, recurrent, severe with psychotic symptoms: Secondary | ICD-10-CM | POA: Diagnosis not present

## 2015-12-21 DIAGNOSIS — F419 Anxiety disorder, unspecified: Secondary | ICD-10-CM | POA: Diagnosis not present

## 2015-12-26 ENCOUNTER — Emergency Department
Admission: EM | Admit: 2015-12-26 | Discharge: 2015-12-27 | Disposition: A | Discharge status: Home / Self Care | Payer: Commercial Managed Care - HMO | Attending: Emergency Medicine | Admitting: Emergency Medicine

## 2015-12-26 ENCOUNTER — Emergency Department: Payer: Commercial Managed Care - HMO

## 2015-12-26 ENCOUNTER — Encounter: Payer: Self-pay | Admitting: Emergency Medicine

## 2015-12-26 DIAGNOSIS — Z8781 Personal history of (healed) traumatic fracture: Secondary | ICD-10-CM | POA: Insufficient documentation

## 2015-12-26 DIAGNOSIS — S22080A Wedge compression fracture of T11-T12 vertebra, initial encounter for closed fracture: Secondary | ICD-10-CM | POA: Insufficient documentation

## 2015-12-26 DIAGNOSIS — F0390 Unspecified dementia without behavioral disturbance: Secondary | ICD-10-CM | POA: Insufficient documentation

## 2015-12-26 DIAGNOSIS — M546 Pain in thoracic spine: Secondary | ICD-10-CM | POA: Diagnosis not present

## 2015-12-26 DIAGNOSIS — R109 Unspecified abdominal pain: Secondary | ICD-10-CM | POA: Insufficient documentation

## 2015-12-26 DIAGNOSIS — R1030 Lower abdominal pain, unspecified: Secondary | ICD-10-CM | POA: Diagnosis not present

## 2015-12-26 DIAGNOSIS — M5489 Other dorsalgia: Secondary | ICD-10-CM

## 2015-12-26 DIAGNOSIS — Y9289 Other specified places as the place of occurrence of the external cause: Secondary | ICD-10-CM | POA: Insufficient documentation

## 2015-12-26 DIAGNOSIS — W1839XA Other fall on same level, initial encounter: Secondary | ICD-10-CM | POA: Diagnosis not present

## 2015-12-26 DIAGNOSIS — Y999 Unspecified external cause status: Secondary | ICD-10-CM | POA: Diagnosis not present

## 2015-12-26 DIAGNOSIS — Z87891 Personal history of nicotine dependence: Secondary | ICD-10-CM | POA: Diagnosis not present

## 2015-12-26 DIAGNOSIS — Y9389 Activity, other specified: Secondary | ICD-10-CM | POA: Diagnosis not present

## 2015-12-26 DIAGNOSIS — Z79899 Other long term (current) drug therapy: Secondary | ICD-10-CM | POA: Diagnosis not present

## 2015-12-26 DIAGNOSIS — Z743 Need for continuous supervision: Secondary | ICD-10-CM | POA: Diagnosis not present

## 2015-12-26 DIAGNOSIS — M549 Dorsalgia, unspecified: Secondary | ICD-10-CM | POA: Diagnosis not present

## 2015-12-26 LAB — CBC WITH DIFFERENTIAL/PLATELET
BASOS ABS: 0.1 10*3/uL (ref 0–0.1)
Basophils Relative: 1 %
EOS ABS: 0 10*3/uL (ref 0–0.7)
EOS PCT: 0 %
HCT: 36.3 % (ref 35.0–47.0)
Hemoglobin: 11.8 g/dL — ABNORMAL LOW (ref 12.0–16.0)
LYMPHS PCT: 14 %
Lymphs Abs: 1.5 10*3/uL (ref 1.0–3.6)
MCH: 19.7 pg — ABNORMAL LOW (ref 26.0–34.0)
MCHC: 32.4 g/dL (ref 32.0–36.0)
MCV: 60.7 fL — AB (ref 80.0–100.0)
MONO ABS: 0.5 10*3/uL (ref 0.2–0.9)
Monocytes Relative: 5 %
Neutro Abs: 8.6 10*3/uL — ABNORMAL HIGH (ref 1.4–6.5)
Neutrophils Relative %: 80 %
PLATELETS: 278 10*3/uL (ref 150–440)
RBC: 5.98 MIL/uL — ABNORMAL HIGH (ref 3.80–5.20)
RDW: 17.8 % — AB (ref 11.5–14.5)
WBC: 10.6 10*3/uL (ref 3.6–11.0)

## 2015-12-26 LAB — COMPREHENSIVE METABOLIC PANEL
ALBUMIN: 4.1 g/dL (ref 3.5–5.0)
ALK PHOS: 97 U/L (ref 38–126)
ALT: 15 U/L (ref 14–54)
AST: 22 U/L (ref 15–41)
Anion gap: 7 (ref 5–15)
BILIRUBIN TOTAL: 1 mg/dL (ref 0.3–1.2)
BUN: 18 mg/dL (ref 6–20)
CALCIUM: 9.1 mg/dL (ref 8.9–10.3)
CO2: 28 mmol/L (ref 22–32)
CREATININE: 0.78 mg/dL (ref 0.44–1.00)
Chloride: 106 mmol/L (ref 101–111)
GFR calc Af Amer: >60 mL/min (ref >60)
GLUCOSE: 99 mg/dL (ref 65–99)
Potassium: 3 mmol/L — ABNORMAL LOW (ref 3.5–5.1)
Sodium: 141 mmol/L (ref 135–145)
TOTAL PROTEIN: 7.1 g/dL (ref 6.5–8.1)

## 2015-12-26 LAB — URINALYSIS COMPLETE WITH MICROSCOPIC (ARMC ONLY)
BILIRUBIN URINE: NEGATIVE
GLUCOSE, UA: NEGATIVE mg/dL
Nitrite: NEGATIVE
Protein, ur: 30 mg/dL — AB
Specific Gravity, Urine: 1.021 (ref 1.005–1.030)
pH: 5 (ref 5.0–8.0)

## 2015-12-26 LAB — LACTIC ACID, PLASMA: LACTIC ACID, VENOUS: 1.2 mmol/L (ref 0.5–1.9)

## 2015-12-26 MED ORDER — OXYCODONE HCL 5 MG PO TABS: 5.0000 mg | ORAL_TABLET | Freq: Three times a day (TID) | ORAL | 0 refills | Status: DC | PRN

## 2015-12-26 MED ORDER — MORPHINE SULFATE (PF) 2 MG/ML IV SOLN
2.0000 mg | Freq: Once | INTRAVENOUS | Status: AC
  Administered 2015-12-26: 2 mg via INTRAVENOUS
  Filled 2015-12-26: qty 1

## 2015-12-26 MED ORDER — ACETAMINOPHEN 500 MG PO TABS
1000.0000 mg | ORAL_TABLET | Freq: Once | ORAL | Status: DC
  Filled 2015-12-26: qty 2

## 2015-12-26 MED ORDER — SODIUM CHLORIDE 0.9 % IV BOLUS (SEPSIS)
500.0000 mL | Freq: Once | INTRAVENOUS | Status: AC
  Administered 2015-12-26: 500 mL via INTRAVENOUS

## 2015-12-26 MED ORDER — ONDANSETRON HCL 4 MG/2ML IJ SOLN
4.0000 mg | Freq: Once | INTRAMUSCULAR | Status: AC
  Administered 2015-12-26: 4 mg via INTRAVENOUS
  Filled 2015-12-26: qty 2

## 2015-12-26 MED ORDER — OXYCODONE HCL 5 MG PO TABS
5.0000 mg | ORAL_TABLET | Freq: Once | ORAL | Status: DC
  Filled 2015-12-26: qty 1

## 2015-12-26 MED ORDER — LIDOCAINE 5 % EX PTCH
1.0000 | MEDICATED_PATCH | Freq: Two times a day (BID) | CUTANEOUS | 0 refills | Status: AC
Start: 2015-12-26 — End: 2016-12-25

## 2015-12-26 MED ORDER — POTASSIUM CHLORIDE CRYS ER 20 MEQ PO TBCR
40.0000 meq | EXTENDED_RELEASE_TABLET | Freq: Once | ORAL | Status: DC
  Filled 2015-12-26: qty 2

## 2015-12-26 MED ORDER — SENNA 8.6 MG PO TABS: 1.0000 | ORAL_TABLET | Freq: Every day | ORAL | 0 refills | Status: DC

## 2015-12-26 NOTE — Discharge Instructions (Signed)
Pain control: Take tylenol 1000mg  every 8 hours. Take 5mg  of oxycodone every 6 hours for breakthrough pain. If you need the oxycodone make sure to take one senokot as well to prevent constipation. Lidoderm patch every 12 hours.   Do not drink alcohol, drive or participate in any other potentially dangerous activities while taking this medication as it may make you sleepy. Do not take this medication with any other sedating medications, either prescription or over-the-counter.

## 2015-12-26 NOTE — ED Notes (Signed)
Pt keeps taking off her pulse ox and bp cuff.

## 2015-12-26 NOTE — ED Notes (Signed)
xr in with pt attempting xr

## 2015-12-26 NOTE — ED Triage Notes (Signed)
Pt from [facility] c/o abd pain - nonspecific. Poor historian with dementia/alzheimers

## 2015-12-26 NOTE — ED Provider Notes (Signed)
[facility] Emergency Department Provider Note  ____________________________________________  Time seen: Approximately 4:33 PM  I have reviewed the triage vital signs and the nursing notes.   HISTORY  Chief Complaint Back Pain  Level 5 caveat:  Portions of the history and physical were unable to be obtained due to dementia   HPI Michelle Ballard is a 82 y.o. female with a history of dementia, multiple falls, multiple pelvic fractures who presents for evaluation of right abdomen/right hip pain. Patient's son was visiting today and she started complaining of pain in her right hip. The son talked with the staff at the facility and they said that she had been complaining of pain since yesterday. She was recently diagnosed with a UTI and is taking Cipro. Patient at this times complaining of pain everywhere. When I ask her to point where she hurst she points to her suprapubic region however per family she was pointing to the R hip prior to transport. Patient spends most of the time in a wheelchair and has had multiple falls from it. She usually helps herself back up into the wheelchair to facility does not know when her last fall was.  Past Medical History:  Diagnosis Date  . Dementia   . Multiple pelvic fractures [facility]     Patient Active Problem List   Diagnosis Date Noted  . Dizziness 12/04/2015  . Vitamin B12 deficiency 12/04/2015  . Hypokalemia 11/16/2015  . Anemia 11/16/2015  . Dementia with behavioral disturbance 11/16/2015  . Pelvic fracture, closed, initial encounter 11/13/2015    Past Surgical History:  Procedure Laterality Date  . NO PAST SURGERIES      Prior to Admission medications   Medication Sig Start Date End Date Taking? Authorizing Provider  LORazepam (ATIVAN) 0.5 MG tablet Take 1 tablet (0.5 mg total) by mouth 2 (two) times daily. 11/16/15  Yes Katharina Caper, MD  LORazepam (ATIVAN) 1 MG tablet Take 1 tablet (1 mg total) by mouth 2 (two)  times daily as needed for anxiety. 11/16/15  Yes Katharina Caper, MD  diclofenac sodium (VOLTAREN) 1 % GEL Apply 2 g topically 2 (two) times daily.    Historical Provider, MD  docusate sodium (COLACE) 100 MG capsule Take 1 capsule (100 mg total) by mouth 2 (two) times daily. 11/16/15   Katharina Caper, MD  donepezil (ARICEPT) 10 MG tablet Take 10 mg by mouth daily.     Historical Provider, MD  HYDROcodone-acetaminophen (NORCO/VICODIN) 5-325 MG tablet Take 0.5 tablets by mouth every 6 (six) hours as needed for moderate pain or severe pain. 11/16/15   Katharina Caper, MD  lidocaine (LIDODERM) 5 % Place 1 patch onto the skin every 12 (twelve) hours. Remove & Discard patch within 12 hours or as directed by MD 12/26/15 12/25/16  Nita Sickle, MD  memantine (NAMENDA) 10 MG tablet Take 10 mg by mouth 2 (two) times daily.    Historical Provider, MD  oxyCODONE (ROXICODONE) 5 MG immediate release tablet Take 1 tablet (5 mg total) by mouth every 8 (eight) hours as needed. 12/26/15 12/25/16  Nita Sickle, MD  polyethylene glycol (MIRALAX / GLYCOLAX) packet Take 17 g by mouth daily as needed for mild constipation. 11/16/15   Katharina Caper, MD  QUEtiapine (SEROQUEL) 25 MG tablet Take 25 mg by mouth at bedtime.    Historical Provider, MD  senna (SENOKOT) 8.6 MG TABS tablet Take 1 tablet (8.6 mg total) by mouth daily. 12/26/15   Nita Sickle, MD  sertraline (ZOLOFT) 100  MG tablet Take 100 mg by mouth daily.    Historical Provider, MD    Allergies Review of patient's allergies indicates no known allergies.  Family History  Problem Relation Age of Onset  . Lung cancer Mother   . Lung cancer Father   . Cirrhosis Brother     Social History Social History  Substance Use Topics  . Smoking status: Former Games developer  . Smokeless tobacco: Never Used  . Alcohol use No    Review of Systems  Constitutional: Negative for fever. Eyes: Negative for visual changes. ENT: Negative for sore throat. Cardiovascular:  Negative for chest pain. Respiratory: Negative for shortness of breath. Gastrointestinal: + suprapubic pain. Negative for vomiting or diarrhea. Genitourinary: Negative for dysuria. Musculoskeletal: Negative for back pain. + R hip pain Skin: Negative for rash. Neurological: Negative for headaches, weakness or numbness.  ____________________________________________   PHYSICAL EXAM:  VITAL SIGNS:  Constitutional: Alert and oriented x 1, moderate distress complaining of pain all over.  HEENT:      Head: Normocephalic and atraumatic.         Eyes: Conjunctivae are normal. Sclera is non-icteric. EOMI. PERRL      Mouth/Throat: Mucous membranes are dry.       Neck: Supple with no signs of meningismus. Cardiovascular: Regular rate and rhythm. No murmurs, gallops, or rubs. 2+ symmetrical distal pulses are present in all extremities. No JVD. Respiratory: Normal respiratory effort. Lungs are clear to auscultation bilaterally. No wheezes, crackles, or rhonchi.  Gastrointestinal: Soft, non tender throughout, and non distended with positive bowel sounds. No rebound or guarding. Genitourinary: No CVA tenderness. Musculoskeletal: Nontender with normal range of motion in all extremities. No edema, cyanosis, or erythema of extremities. No midline CT and L-spine tenderness. Full painless range of motion of bilateral hips, no tenderness to palpation of bilateral femur, bilateral knee, bilateral leg. Neurologic: Normal speech and language. Face is symmetric. Moving all extremities. No gross focal neurologic deficits are appreciated. Skin: bruise over the R proximal lateral femur  ____________________________________________   LABS (all labs ordered are listed, but only abnormal results are displayed)  Labs Reviewed  COMPREHENSIVE METABOLIC PANEL - Abnormal; Notable for the following:       Result Value   Potassium 3.0 (*)    All other components within normal limits  CBC WITH DIFFERENTIAL/PLATELET -  Abnormal; Notable for the following:    RBC 5.98 (*)    Hemoglobin 11.8 (*)    MCV 60.7 (*)    MCH 19.7 (*)    RDW 17.8 (*)    Neutro Abs 8.6 (*)    All other components within normal limits  URINALYSIS COMPLETEWITH MICROSCOPIC ([facility] ONLY) - Abnormal; Notable for the following:    Color, Urine YELLOW (*)    APPearance HAZY (*)    Ketones, ur 2+ (*)    Hgb urine dipstick 1+ (*)    Protein, ur 30 (*)    Leukocytes, UA TRACE (*)    Bacteria, UA RARE (*)    Squamous Epithelial / LPF 0-5 (*)    All other components within normal limits  URINE CULTURE  LACTIC ACID, PLASMA   ____________________________________________  EKG  ED ECG REPORT I, Nita Sickle, the attending physician, personally viewed and interpreted this ECG. Sinus rhythm, rate of 66, right bundle branch block, normal QTc interval, normal axis, no ST elevations or depressions, T-wave inversions on an anterior leads. Unchanged from prior.  ____________________________________________  RADIOLOGY  CT renal: 1. Mild superior T12 vertebral  compression fracture, new since 10/21/2015. 2. Healing subacute bilateral sacral annular, left L5 transverse process and bilateral superior and inferior pubic ramus fractures. 3. Otherwise no acute abnormality. No evidence of bowel obstruction or acute bowel inflammation . No hydronephrosis. No urolithiasis. 4. Additional findings include ectatic atherosclerotic infrarenal abdominal aorta maximum diameter 2.6 cm, right coronary atherosclerosis and minimal sigmoid diverticulosis.   XR pelvis: No acute findings.  Old pelvic fractures as described.  Mild degenerative change of the hips with moderate degenerate change of the spine and sacroiliac joints.   KUB: Nonobstructive bowel gas pattern.  Old bilateral pelvic fractures.   ____________________________________________   PROCEDURES  Procedure(s) performed: None Procedures Critical Care performed:   None ____________________________________________   INITIAL IMPRESSION / ASSESSMENT AND PLAN / ED COURSE  82 y.o. female with a history of dementia, multiple falls, multiple pelvic fractures who presents for evaluation of right abdomen/right hip pain since yesterday. Currently being treated for UTI. Patient is in obvious distress however am unable to reproduce her pain with palpation of her abdomen, palpation of her femur, palpation of her pelvis, and range of motion of bilateral hips. She has no flank tenderness. She keeps telling me that she hurts all over. We'll give her IV fluids, IV morphine, and IV Zofran for pain. We'll check blood work, urinalysis, KUB, bilateral hip x-rays. We'll check lactate as patient does have pain out of proportion to exam.  Clinical Course  Comment By Time  Labs, KUB, pelvis x-ray with no acute findings. UA positive for blood. We'll pursue a CT renal to rule out kidney stone on the right side. Nita Sicklearolina Princetta Uplinger, MD 09/16 1947  CT showing a T12 mild compression fracture that is new from July 2017. No other acute findings. Patient's pain is well-controlled by mouth medications. I have discussed this finding with Dr. Bevely Palmeritty, [facility] who recommended no transfer for emergent evaluation at this time. He recommended f/u with him in 4 weeks only if patient still having pain. Patient will be discharged home on standing Tylenol 1000 mg, oxycodone 5 mg every 6 hours when necessary, Lidoderm patch, and referral for [facility] for follow-up. Nita Sicklearolina Etola Mull, MD 09/16 2249    Pertinent labs & imaging results that were available during my care of the patient were reviewed by me and considered in my medical decision making (see chart for details).    ____________________________________________   FINAL CLINICAL IMPRESSION(S) / ED DIAGNOSES  Final diagnoses:  Right-sided back pain, unspecified location  Abdominal pain, unspecified abdominal location   T12 compression fracture, initial encounter (HCC)      NEW MEDICATIONS STARTED DURING THIS VISIT:  New Prescriptions   LIDOCAINE (LIDODERM) 5 %    Place 1 patch onto the skin every 12 (twelve) hours. Remove & Discard patch within 12 hours or as directed by MD   OXYCODONE (ROXICODONE) 5 MG IMMEDIATE RELEASE TABLET    Take 1 tablet (5 mg total) by mouth every 8 (eight) hours as needed.   SENNA (SENOKOT) 8.6 MG TABS TABLET    Take 1 tablet (8.6 mg total) by mouth daily.     Note:  This document was prepared using Dragon voice recognition software and may include unintentional dictation errors.    Nita Sicklearolina Larsen Zettel, MD 12/26/15 2255

## 2015-12-26 NOTE — ED Notes (Signed)
Pt sleeping soundly - hold meds for now.

## 2015-12-27 MED ORDER — POTASSIUM CHLORIDE ER 10 MEQ PO TBCR: 40.0000 meq | EXTENDED_RELEASE_TABLET | Freq: Once | ORAL | 0 refills | Status: DC

## 2015-12-27 NOTE — ED Notes (Signed)
Had to place 0/10 pain due to computer not allowing no data, pt not in facility at this time.

## 2015-12-27 NOTE — ED Notes (Signed)
[facility] [place]

## 2015-12-27 NOTE — ED Notes (Signed)
Per [facility] at [facility] [place], they are not able to come pick pt up at this time, staff at home [facility] [place] are requesting ems transport back to facility.

## 2015-12-29 LAB — URINE CULTURE

## 2015-12-30 DIAGNOSIS — S32810D Multiple fractures of pelvis with stable disruption of pelvic ring, subsequent encounter for fracture with routine healing: Secondary | ICD-10-CM | POA: Diagnosis not present

## 2016-01-01 DIAGNOSIS — F3341 Major depressive disorder, recurrent, in partial remission: Secondary | ICD-10-CM | POA: Diagnosis not present

## 2016-01-01 DIAGNOSIS — F0391 Unspecified dementia with behavioral disturbance: Secondary | ICD-10-CM | POA: Diagnosis not present

## 2016-01-01 DIAGNOSIS — M5136 Other intervertebral disc degeneration, lumbar region: Secondary | ICD-10-CM | POA: Diagnosis not present

## 2016-01-01 DIAGNOSIS — S32810D Multiple fractures of pelvis with stable disruption of pelvic ring, subsequent encounter for fracture with routine healing: Secondary | ICD-10-CM | POA: Diagnosis not present

## 2016-01-01 DIAGNOSIS — M150 Primary generalized (osteo)arthritis: Secondary | ICD-10-CM | POA: Diagnosis not present

## 2016-01-01 DIAGNOSIS — M810 Age-related osteoporosis without current pathological fracture: Secondary | ICD-10-CM | POA: Diagnosis not present

## 2016-01-04 DIAGNOSIS — M810 Age-related osteoporosis without current pathological fracture: Secondary | ICD-10-CM | POA: Diagnosis not present

## 2016-01-04 DIAGNOSIS — F0150 Vascular dementia without behavioral disturbance: Secondary | ICD-10-CM | POA: Diagnosis not present

## 2016-01-04 DIAGNOSIS — F0391 Unspecified dementia with behavioral disturbance: Secondary | ICD-10-CM | POA: Diagnosis not present

## 2016-01-04 DIAGNOSIS — M5136 Other intervertebral disc degeneration, lumbar region: Secondary | ICD-10-CM | POA: Diagnosis not present

## 2016-01-04 DIAGNOSIS — F3341 Major depressive disorder, recurrent, in partial remission: Secondary | ICD-10-CM | POA: Diagnosis not present

## 2016-01-04 DIAGNOSIS — M150 Primary generalized (osteo)arthritis: Secondary | ICD-10-CM | POA: Diagnosis not present

## 2016-01-04 DIAGNOSIS — F3289 Other specified depressive episodes: Secondary | ICD-10-CM | POA: Diagnosis not present

## 2016-01-04 DIAGNOSIS — S22080D Wedge compression fracture of T11-T12 vertebra, subsequent encounter for fracture with routine healing: Secondary | ICD-10-CM | POA: Diagnosis not present

## 2016-01-04 DIAGNOSIS — S32810D Multiple fractures of pelvis with stable disruption of pelvic ring, subsequent encounter for fracture with routine healing: Secondary | ICD-10-CM | POA: Diagnosis not present

## 2016-01-05 ENCOUNTER — Emergency Department: Payer: Commercial Managed Care - HMO

## 2016-01-05 ENCOUNTER — Inpatient Hospital Stay
Admission: EM | Admit: 2016-01-05 | Discharge: 2016-01-08 | DRG: 689 | Disposition: A | Discharge status: Skilled Nursing Facility | Payer: Commercial Managed Care - HMO | Attending: Internal Medicine | Admitting: Internal Medicine

## 2016-01-05 DIAGNOSIS — Z801 Family history of malignant neoplasm of trachea, bronchus and lung: Secondary | ICD-10-CM | POA: Diagnosis not present

## 2016-01-05 DIAGNOSIS — E876 Hypokalemia: Secondary | ICD-10-CM | POA: Diagnosis present

## 2016-01-05 DIAGNOSIS — R4182 Altered mental status, unspecified: Secondary | ICD-10-CM

## 2016-01-05 DIAGNOSIS — N390 Urinary tract infection, site not specified: Secondary | ICD-10-CM | POA: Diagnosis not present

## 2016-01-05 DIAGNOSIS — F0280 Dementia in other diseases classified elsewhere without behavioral disturbance: Secondary | ICD-10-CM | POA: Diagnosis not present

## 2016-01-05 DIAGNOSIS — L8990 Pressure ulcer of unspecified site, unspecified stage: Secondary | ICD-10-CM | POA: Insufficient documentation

## 2016-01-05 DIAGNOSIS — E870 Hyperosmolality and hypernatremia: Secondary | ICD-10-CM

## 2016-01-05 DIAGNOSIS — M6281 Muscle weakness (generalized): Secondary | ICD-10-CM

## 2016-01-05 DIAGNOSIS — L89152 Pressure ulcer of sacral region, stage 2: Secondary | ICD-10-CM | POA: Diagnosis not present

## 2016-01-05 DIAGNOSIS — Z79899 Other long term (current) drug therapy: Secondary | ICD-10-CM | POA: Diagnosis not present

## 2016-01-05 DIAGNOSIS — Z87891 Personal history of nicotine dependence: Secondary | ICD-10-CM | POA: Diagnosis not present

## 2016-01-05 DIAGNOSIS — Z9189 Other specified personal risk factors, not elsewhere classified: Secondary | ICD-10-CM

## 2016-01-05 DIAGNOSIS — E860 Dehydration: Secondary | ICD-10-CM | POA: Diagnosis not present

## 2016-01-05 DIAGNOSIS — N179 Acute kidney failure, unspecified: Secondary | ICD-10-CM | POA: Diagnosis not present

## 2016-01-05 DIAGNOSIS — G9340 Encephalopathy, unspecified: Secondary | ICD-10-CM | POA: Diagnosis not present

## 2016-01-05 DIAGNOSIS — E871 Hypo-osmolality and hyponatremia: Secondary | ICD-10-CM | POA: Diagnosis not present

## 2016-01-05 DIAGNOSIS — T43212A Poisoning by selective serotonin and norepinephrine reuptake inhibitors, intentional self-harm, initial encounter: Secondary | ICD-10-CM | POA: Diagnosis not present

## 2016-01-05 DIAGNOSIS — R262 Difficulty in walking, not elsewhere classified: Secondary | ICD-10-CM

## 2016-01-05 DIAGNOSIS — R4020 Unspecified coma: Secondary | ICD-10-CM | POA: Diagnosis not present

## 2016-01-05 LAB — TROPONIN I

## 2016-01-05 LAB — CBC WITH DIFFERENTIAL/PLATELET
Basophils Absolute: 0 10*3/uL (ref 0–0.1)
Basophils Relative: 1 %
Eosinophils Absolute: 0 10*3/uL (ref 0–0.7)
Eosinophils Relative: 0 %
HEMATOCRIT: 51.3 % — AB (ref 35.0–47.0)
HEMOGLOBIN: 16.4 g/dL — AB (ref 12.0–16.0)
LYMPHS ABS: 0.8 10*3/uL — AB (ref 1.0–3.6)
LYMPHS PCT: 13 %
MCH: 19.8 pg — AB (ref 26.0–34.0)
MCHC: 32 g/dL (ref 32.0–36.0)
MCV: 61.7 fL — AB (ref 80.0–100.0)
MONOS PCT: 5 %
Monocytes Absolute: 0.3 10*3/uL (ref 0.2–0.9)
NEUTROS ABS: 5.4 10*3/uL (ref 1.4–6.5)
NEUTROS PCT: 81 %
Platelets: 387 10*3/uL (ref 150–440)
RBC: 8.31 MIL/uL — AB (ref 3.80–5.20)
RDW: 18 % — ABNORMAL HIGH (ref 11.5–14.5)
WBC: 6.6 10*3/uL (ref 3.6–11.0)

## 2016-01-05 LAB — URINE DRUG SCREEN, QUALITATIVE (ARMC ONLY)
Amphetamines, Ur Screen: NOT DETECTED
Barbiturates, Ur Screen: NOT DETECTED
Benzodiazepine, Ur Scrn: POSITIVE — AB
CANNABINOID 50 NG, UR ~~LOC~~: NOT DETECTED
COCAINE METABOLITE, UR ~~LOC~~: NOT DETECTED
MDMA (ECSTASY) UR SCREEN: NOT DETECTED
Methadone Scn, Ur: NOT DETECTED
OPIATE, UR SCREEN: POSITIVE — AB
Phencyclidine (PCP) Ur S: NOT DETECTED
TRICYCLIC, UR SCREEN: POSITIVE — AB

## 2016-01-05 LAB — ACETAMINOPHEN LEVEL: Acetaminophen (Tylenol), Serum: <10 ug/mL — ABNORMAL LOW (ref 10–30)

## 2016-01-05 LAB — URINALYSIS COMPLETE WITH MICROSCOPIC (ARMC ONLY)
Glucose, UA: NEGATIVE mg/dL
Hgb urine dipstick: NEGATIVE
Leukocytes, UA: NEGATIVE
NITRITE: NEGATIVE
PH: 5 (ref 5.0–8.0)
PROTEIN: 100 mg/dL — AB
SPECIFIC GRAVITY, URINE: 1.026 (ref 1.005–1.030)
SQUAMOUS EPITHELIAL / LPF: NONE SEEN

## 2016-01-05 LAB — COMPREHENSIVE METABOLIC PANEL
ALT: 21 U/L (ref 14–54)
AST: 30 U/L (ref 15–41)
Albumin: 4.5 g/dL (ref 3.5–5.0)
Alkaline Phosphatase: 98 U/L (ref 38–126)
Anion gap: 12 (ref 5–15)
BUN: 59 mg/dL — ABNORMAL HIGH (ref 6–20)
CHLORIDE: 114 mmol/L — AB (ref 101–111)
CO2: 29 mmol/L (ref 22–32)
Calcium: 10 mg/dL (ref 8.9–10.3)
Creatinine, Ser: 1.4 mg/dL — ABNORMAL HIGH (ref 0.44–1.00)
GFR, EST AFRICAN AMERICAN: 39 mL/min — AB (ref >60)
GFR, EST NON AFRICAN AMERICAN: 34 mL/min — AB (ref >60)
Glucose, Bld: 105 mg/dL — ABNORMAL HIGH (ref 65–99)
POTASSIUM: 3.8 mmol/L (ref 3.5–5.1)
SODIUM: 155 mmol/L — AB (ref 135–145)
Total Bilirubin: 1 mg/dL (ref 0.3–1.2)
Total Protein: 8.4 g/dL — ABNORMAL HIGH (ref 6.5–8.1)

## 2016-01-05 LAB — ETHANOL

## 2016-01-05 LAB — SALICYLATE LEVEL: Salicylate Lvl: <4 mg/dL (ref 2.8–30.0)

## 2016-01-05 MED ORDER — DEXTROSE 5 % IV SOLN
1.0000 g | Freq: Once | INTRAVENOUS | Status: AC
  Administered 2016-01-05: 1 g via INTRAVENOUS
  Filled 2016-01-05: qty 10

## 2016-01-05 MED ORDER — SODIUM CHLORIDE 0.45 % IV SOLN
INTRAVENOUS | Status: DC
  Administered 2016-01-05: 19:00:00 via INTRAVENOUS

## 2016-01-05 MED ORDER — ACETAMINOPHEN 325 MG PO TABS
650.0000 mg | ORAL_TABLET | Freq: Three times a day (TID) | ORAL | Status: DC
  Administered 2016-01-05 – 2016-01-08 (×8): 650 mg via ORAL
  Filled 2016-01-05 (×8): qty 2

## 2016-01-05 MED ORDER — SODIUM CHLORIDE 0.9 % IV BOLUS (SEPSIS)
1000.0000 mL | Freq: Once | INTRAVENOUS | Status: AC
  Administered 2016-01-05: 1000 mL via INTRAVENOUS

## 2016-01-05 MED ORDER — SENNA 8.6 MG PO TABS
1.0000 | ORAL_TABLET | Freq: Two times a day (BID) | ORAL | Status: DC
  Administered 2016-01-05 – 2016-01-07 (×4): 8.6 mg via ORAL
  Filled 2016-01-05 (×4): qty 1

## 2016-01-05 MED ORDER — INFLUENZA VAC SPLIT QUAD 0.5 ML IM SUSY
0.5000 mL | PREFILLED_SYRINGE | INTRAMUSCULAR | Status: AC
  Administered 2016-01-07: 0.5 mL via INTRAMUSCULAR
  Filled 2016-01-05: qty 0.5

## 2016-01-05 MED ORDER — HEPARIN SODIUM (PORCINE) 5000 UNIT/ML IJ SOLN
5000.0000 [IU] | Freq: Three times a day (TID) | INTRAMUSCULAR | Status: DC
  Administered 2016-01-05 – 2016-01-06 (×2): 5000 [IU] via SUBCUTANEOUS
  Filled 2016-01-05 (×2): qty 1

## 2016-01-05 MED ORDER — DEXTROSE 5 % IV SOLN
1.0000 g | INTRAVENOUS | Status: DC
  Administered 2016-01-06 – 2016-01-07 (×2): 1 g via INTRAVENOUS
  Filled 2016-01-05 (×3): qty 10

## 2016-01-05 MED ORDER — LIDOCAINE 5 % EX PTCH
1.0000 | MEDICATED_PATCH | Freq: Two times a day (BID) | CUTANEOUS | Status: DC
  Administered 2016-01-05 – 2016-01-08 (×6): 1 via TRANSDERMAL
  Filled 2016-01-05 (×7): qty 1

## 2016-01-05 NOTE — H&P (Signed)
[facility] [place] [facility]   PATIENT NAME: Michelle Ballard    MR#:  [id number]  DATE OF BIRTH:  [birth year]  DATE OF ADMISSION:  01/05/2016  PRIMARY CARE PHYSICIAN: Barbette ReichmannHANDE,VISHWANATH, MD   REQUESTING/REFERRING PHYSICIAN: Eber Jonesarolyn  CHIEF COMPLAINT:   Chief Complaint  Patient presents with  . Altered Mental Status    HISTORY OF PRESENT ILLNESS: Michelle Ballard  is a 82 y.o. female with a known history of Dementia, fractures on pelvis- lives at NH- had UTI and was on cipro in last 2 weeks, But Cx result shows - E coli resistant to cipro. She was seen in ER last week for pain- and was given oxycodon. She was not eating good for last 4-5 days, and lethargic, but at NH they kept giving her Lorazepam and Oxycodon with apple sauce. In ER today noted dehydrated, UTI, and altered mental status.  PAST MEDICAL HISTORY:   Past Medical History:  Diagnosis Date  . Dementia   . Multiple pelvic fractures (HCC)     PAST SURGICAL HISTORY: Past Surgical History:  Procedure Laterality Date  . NO PAST SURGERIES      SOCIAL HISTORY:  Social History  Substance Use Topics  . Smoking status: Former Games developermoker  . Smokeless tobacco: Never Used  . Alcohol use No    FAMILY HISTORY:  Family History  Problem Relation Age of Onset  . Lung cancer Mother   . Lung cancer Father   . Cirrhosis Brother     DRUG ALLERGIES: No Known Allergies  REVIEW OF SYSTEMS:   Pt is lethargic, not able to give ROS.  MEDICATIONS AT HOME:  Prior to Admission medications   Medication Sig Start Date End Date Taking? Authorizing Provider  acetaminophen (TYLENOL) 650 MG CR tablet Take 650 mg by mouth every 8 (eight) hours.   Yes Historical Provider, MD  lidocaine (LIDODERM) 5 % Place 1 patch onto the skin every 12 (twelve) hours. Remove & Discard patch within 12 hours or as directed by MD 12/26/15 12/25/16 Yes Nita Sicklearolina Veronese, MD  LORazepam (ATIVAN) 1 MG tablet Take 1 tablet (1 mg total) by mouth 2  (two) times daily as needed for anxiety. 11/16/15  Yes Katharina Caperima [place], MD  oxyCODONE (ROXICODONE) 5 MG immediate release tablet Take 1 tablet (5 mg total) by mouth every 8 (eight) hours as needed. Patient taking differently: Take 2.5 mg by mouth every 8 (eight) hours as needed for moderate pain or severe pain.  12/26/15 12/25/16 Yes Nita Sicklearolina Veronese, MD  senna (SENOKOT) 8.6 MG TABS tablet Take 1 tablet (8.6 mg total) by mouth daily. Patient taking differently: Take 1 tablet by mouth 2 (two) times daily.  12/26/15  Yes Nita Sicklearolina Veronese, MD  HYDROcodone-acetaminophen (NORCO/VICODIN) 5-325 MG tablet Take 0.5 tablets by mouth every 6 (six) hours as needed for moderate pain or severe pain. Patient not taking: Reported on 01/05/2016 11/16/15   Katharina Caperima [place], MD  LORazepam (ATIVAN) 0.5 MG tablet Take 1 tablet (0.5 mg total) by mouth 2 (two) times daily. Patient not taking: Reported on 01/05/2016 11/16/15   Katharina Caperima [place], MD  polyethylene glycol (MIRALAX / GLYCOLAX) packet Take 17 g by mouth daily as needed for mild constipation. Patient not taking: Reported on 01/05/2016 11/16/15   Katharina Caperima [place], MD  potassium chloride (K-DUR) 10 MEQ tablet Take 4 tablets (40 mEq total) by mouth once. 12/27/15 12/27/15  Rebecka ApleyAllison P Webster, MD      PHYSICAL EXAMINATION:   VITAL SIGNS: Blood pressure 139/64, pulse 82,  temperature 98.3 F (36.8 C), temperature source Oral, resp. rate 19, weight 59 kg (130 lb), SpO2 96 %.  GENERAL:  82 y.o.-year-old patient lying in the bed with no acute distress.  EYES: Pupils equal, round, reactive to light . No scleral icterus. Extraocular muscles not checked.  HEENT: Head atraumatic, normocephalic. Oropharynx and nasopharynx clear. Mucosa dry. NECK:  Supple, no jugular venous distention. No thyroid enlargement, no tenderness.  LUNGS: Normal breath sounds bilaterally, no wheezing, rales,rhonchi or crepitation. No use of accessory muscles of respiration.  CARDIOVASCULAR: S1, S2 normal. No murmurs,  rubs, or gallops.  ABDOMEN: Soft, nontender, nondistended. Bowel sounds present. No organomegaly or mass.  EXTREMITIES: No pedal edema, cyanosis, or clubbing.  NEUROLOGIC: Pt is lethargic, answers by moaning to name calling , but does not open her eyes and does not follow commands. PSYCHIATRIC: The patient is letahrgic.  SKIN: No obvious rash, lesion, or ulcer.   LABORATORY PANEL:   CBC  Recent Labs Lab 01/05/16 1427  WBC 6.6  HGB 16.4*  HCT 51.3*  PLT 387  MCV 61.7*  MCH 19.8*  MCHC 32.0  RDW 18.0*  LYMPHSABS 0.8*  MONOABS 0.3  EOSABS 0.0  BASOSABS 0.0   ------------------------------------------------------------------------------------------------------------------  Chemistries   Recent Labs Lab 01/05/16 1427  NA 155*  K 3.8  CL 114*  CO2 29  GLUCOSE 105*  BUN 59*  CREATININE 1.40*  CALCIUM 10.0  AST 30  ALT 21  ALKPHOS 98  BILITOT 1.0   ------------------------------------------------------------------------------------------------------------------ estimated creatinine clearance is 26.3 mL/min (by C-G formula based on SCr of 1.4 mg/dL (H)). ------------------------------------------------------------------------------------------------------------------ No results for input(s): TSH, T4TOTAL, T3FREE, THYROIDAB in the last 72 hours.  Invalid input(s): FREET3   Coagulation profile No results for input(s): INR, PROTIME in the last 168 hours. ------------------------------------------------------------------------------------------------------------------- No results for input(s): DDIMER in the last 72 hours. -------------------------------------------------------------------------------------------------------------------  Cardiac Enzymes  Recent Labs Lab 01/05/16 1427  TROPONINI <0.03   ------------------------------------------------------------------------------------------------------------------ Invalid input(s):  POCBNP  ---------------------------------------------------------------------------------------------------------------  Urinalysis    Component Value Date/Time   COLORURINE AMBER (A) 01/05/2016 1427   APPEARANCEUR HAZY (A) 01/05/2016 1427   LABSPEC 1.026 01/05/2016 1427   PHURINE 5.0 01/05/2016 1427   GLUCOSEU NEGATIVE 01/05/2016 1427   HGBUR NEGATIVE 01/05/2016 1427   BILIRUBINUR 1+ (A) 01/05/2016 1427   KETONESUR 1+ (A) 01/05/2016 1427   PROTEINUR 100 (A) 01/05/2016 1427   NITRITE NEGATIVE 01/05/2016 1427   LEUKOCYTESUR NEGATIVE 01/05/2016 1427     RADIOLOGY: Ct Head Wo Contrast  Result Date: 01/05/2016 CLINICAL DATA:  Altered mental status. EXAM: CT HEAD WITHOUT CONTRAST TECHNIQUE: Contiguous axial images were obtained from the base of the skull through the vertex without intravenous contrast. COMPARISON:  11/13/2015 FINDINGS: Brain: Stable cerebral atrophy. Stable low-density in the periventricular white matter. No evidence for acute hemorrhage, mass lesion, midline shift, hydrocephalus or large infarct. Vascular: No hyperdense vessel or unexpected calcification. Skull: Normal. Negative for fracture or focal lesion. Sinuses/Orbits: Small amount of fluid in the right mastoid air cells. Mucosal disease in a posterior right ethmoid air cell. Other: None. IMPRESSION: No acute intracranial abnormality. Stable atrophy. White matter changes are suggestive for chronic small vessel ischemic disease. Electronically Signed   By: Richarda Overlie M.D.   On: 01/05/2016 14:57   Dg Chest Port 1 View  Result Date: 01/05/2016 CLINICAL DATA:  Acute mental status change EXAM: PORTABLE CHEST 1 VIEW COMPARISON:  November 13, 2015 FINDINGS: The heart size borderline. The hila and mediastinum are unremarkable. No pneumothorax. No pulmonary  nodules, masses, or focal infiltrates. IMPRESSION: No active disease. Electronically Signed   By: Gerome Sam III M.D   On: 01/05/2016 16:04    EKG: Orders placed or  performed during the hospital encounter of 01/05/16  . EKG 12-Lead  . EKG 12-Lead    IMPRESSION AND PLAN:  * Altered mental status   Dehydration with Hypernatremia and significant rise in Hgb.   Acute renal faulire     Due to UTI     IV rocephin, Get Urine and blood culture.    IV fluids with 1/2 NS.   Hold Benzo and Opioids.   May need swellow eval, if does not improve much on mental status.  * Hypernatremia   IV fluids and monitor.   All the records are reviewed and case discussed with ED provider. Management plans discussed with the patient, family and they are in agreement.  CODE STATUS: Full Code Status History    Date Active Date Inactive Code Status Order ID Comments User Context   11/13/2015  7:35 PM 11/16/2015 12:52 PM Full Code [id number]  Wyatt Haste, MD ED   10/21/2015 11:57 AM 10/23/2015  4:27 PM Full Code [id number]  Alford Highland, MD ED       TOTAL TIME TAKING CARE OF THIS PATIENT: 50 minutes.    Altamese Dilling M.D on 01/05/2016   Between 7am to 6pm - Pager - [phone]  After 6pm go to [email or web address] - password [email or web address]  [facility]  Office  [phone]  CC: Primary care physician; Barbette Reichmann, MD   Note: This dictation was prepared with Dragon dictation along with smaller phrase technology. Any transcriptional errors that result from this process are unintentional.

## 2016-01-05 NOTE — ED Provider Notes (Signed)
[facility] Emergency Department Provider Note  ____________________________________________  Time seen: Approximately 2:27 PM  I have reviewed the triage vital signs and the nursing notes.   HISTORY  Chief Complaint Altered Mental Status  Level 5 caveat:  Portions of the history and physical were unable to be obtained due to dementia and AMS   HPI Michelle Ballard is a 82 y.o. female history of dementia and multiple pelvic fractures, hypokalemia and anemia who presents for evaluation of altered mental status. According to EMS patient has been less responsive for the last 5 days there were called today because she didn't get up from bed. Vital signs are stable per EMS. Patient opens her eyes and he will moan, responds to painful stimuli but is not answering any questions. EMS gave her 1 mg of Narcan with no response. Patient is on opiates for multiple pelvic fractures. I spoke with nurse Sunny Schlein and her facility who said the patient has been getting progressively more sleepy and less interactive for the last 5 days however today she was not able to get patient to open her eyes or follow any commands which prompted her to call EMS. She reports that the only medication the patient is been taking is her pain medication that has been placed into her pudding. She is not taking any of the other medications. Patient also had an episode of choking some water earlier today and she was concerned the patient might have aspirated.  Past Medical History:  Diagnosis Date  . Dementia   . Multiple pelvic fractures [facility])     Patient Active Problem List   Diagnosis Date Noted  . UTI (lower urinary tract infection) 01/05/2016  . Dehydration 01/05/2016  . Altered mental status 01/05/2016  . Dizziness 12/04/2015  . Vitamin B12 deficiency 12/04/2015  . Hypokalemia 11/16/2015  . Anemia 11/16/2015  . Dementia with behavioral disturbance 11/16/2015  . Pelvic fracture, closed,  initial encounter 11/13/2015    Past Surgical History:  Procedure Laterality Date  . NO PAST SURGERIES      Prior to Admission medications   Medication Sig Start Date End Date Taking? Authorizing Provider  acetaminophen (TYLENOL) 650 MG CR tablet Take 650 mg by mouth every 8 (eight) hours.   Yes Historical Provider, MD  lidocaine (LIDODERM) 5 % Place 1 patch onto the skin every 12 (twelve) hours. Remove & Discard patch within 12 hours or as directed by MD 12/26/15 12/25/16 Yes Nita Sickle, MD  LORazepam (ATIVAN) 1 MG tablet Take 1 tablet (1 mg total) by mouth 2 (two) times daily as needed for anxiety. 11/16/15  Yes Katharina Caper, MD  megestrol (MEGACE) 40 MG tablet Take 40 mg by mouth daily.   Yes Historical Provider, MD  oxyCODONE (ROXICODONE) 5 MG immediate release tablet Take 1 tablet (5 mg total) by mouth every 8 (eight) hours as needed. Patient taking differently: Take 2.5 mg by mouth every 8 (eight) hours as needed for moderate pain or severe pain.  12/26/15 12/25/16 Yes Nita Sickle, MD  senna (SENOKOT) 8.6 MG TABS tablet Take 1 tablet (8.6 mg total) by mouth daily. Patient taking differently: Take 1 tablet by mouth 2 (two) times daily.  12/26/15  Yes Nita Sickle, MD  HYDROcodone-acetaminophen (NORCO/VICODIN) 5-325 MG tablet Take 0.5 tablets by mouth every 6 (six) hours as needed for moderate pain or severe pain. Patient not taking: Reported on 01/05/2016 11/16/15   Katharina Caper, MD  LORazepam (ATIVAN) 0.5 MG tablet Take 1 tablet (  0.5 mg total) by mouth 2 (two) times daily. Patient not taking: Reported on 01/05/2016 11/16/15   Katharina Caper, MD  polyethylene glycol (MIRALAX / GLYCOLAX) packet Take 17 g by mouth daily as needed for mild constipation. Patient not taking: Reported on 01/05/2016 11/16/15   Katharina Caper, MD  potassium chloride (K-DUR) 10 MEQ tablet Take 4 tablets (40 mEq total) by mouth once. 12/27/15 12/27/15  Rebecka Apley, MD    Allergies Review of patient's  allergies indicates no known allergies.  Family History  Problem Relation Age of Onset  . Lung cancer Mother   . Lung cancer Father   . Cirrhosis Brother     Social History Social History  Substance Use Topics  . Smoking status: Former Games developer  . Smokeless tobacco: Never Used  . Alcohol use No    Review of Systems Unable to obtain ____________________________________________   PHYSICAL EXAM:  VITAL SIGNS: ED Triage Vitals [01/05/16 1422]  Enc Vitals Group     BP (!) 157/70     Pulse Rate 87     Resp 15     Temp 98.3 F (36.8 C)     Temp Source Oral     SpO2 98 %     Weight 130 lb (59 kg)     Height      Head Circumference      Peak Flow      Pain Score      Pain Loc      Pain Edu?      Excl. in GC?     Constitutional: Opens her eyes to painful stimuli, moving all 4 extremities, not answering any questions  HEENT:      Head: Normocephalic and atraumatic.         Eyes: Conjunctivae are normal. Sclera is non-icteric. EOMI. PERRL 3mm      Mouth/Throat: Mucous membranes are dry.       Neck: Supple with no signs of meningismus. Cardiovascular: Regular rate and rhythm. No murmurs, gallops, or rubs. 2+ symmetrical distal pulses are present in all extremities. No JVD. Respiratory: Normal respiratory effort. Lungs are clear to auscultation bilaterally. No wheezes, crackles, or rhonchi.  Gastrointestinal: Soft, non tender, and non distended with positive bowel sounds. No rebound or guarding. Musculoskeletal: Nontender with normal range of motion in all extremities. No edema, cyanosis, or erythema of extremities. Neurologic: GCS 8, moving all 4 extremities Skin: Skin is warm, dry and intact. No rash noted.     ____________________________________________   LABS (all labs ordered are listed, but only abnormal results are displayed)  Labs Reviewed  CBC WITH DIFFERENTIAL/PLATELET - Abnormal; Notable for the following:       Result Value   RBC 8.31 (*)     Hemoglobin 16.4 (*)    HCT 51.3 (*)    MCV 61.7 (*)    MCH 19.8 (*)    RDW 18.0 (*)    Lymphs Abs 0.8 (*)    All other components within normal limits  BLOOD GAS, VENOUS - Abnormal; Notable for the following:    pO2, Ven <31.0 (*)    Bicarbonate 32.5 (*)    Acid-Base Excess 5.8 (*)    All other components within normal limits  ACETAMINOPHEN LEVEL - Abnormal; Notable for the following:    Acetaminophen (Tylenol), Serum <10 (*)    All other components within normal limits  URINE DRUG SCREEN, QUALITATIVE ([facility] ONLY) - Abnormal; Notable for the following:    Tricyclic, Ur Screen  POSITIVE (*)    Opiate, Ur Screen POSITIVE (*)    Benzodiazepine, Ur Scrn POSITIVE (*)    All other components within normal limits  URINALYSIS COMPLETEWITH MICROSCOPIC ([facility] ONLY) - Abnormal; Notable for the following:    Color, Urine AMBER (*)    APPearance HAZY (*)    Bilirubin Urine 1+ (*)    Ketones, ur 1+ (*)    Protein, ur 100 (*)    Bacteria, UA FEW (*)    All other components within normal limits  COMPREHENSIVE METABOLIC PANEL - Abnormal; Notable for the following:    Sodium 155 (*)    Chloride 114 (*)    Glucose, Bld 105 (*)    BUN 59 (*)    Creatinine, Ser 1.40 (*)    Total Protein 8.4 (*)    GFR calc non Af Amer 34 (*)    GFR calc Af Amer 39 (*)    All other components within normal limits  URINE CULTURE  URINE CULTURE  CULTURE, BLOOD (ROUTINE X 2)  CULTURE, BLOOD (ROUTINE X 2)  MRSA PCR SCREENING  ETHANOL  SALICYLATE LEVEL  TROPONIN I  BASIC METABOLIC PANEL  CBC   ____________________________________________  EKG  ED ECG REPORT I, Nita Sicklearolina Missi Mcmackin, the attending physician, personally viewed and interpreted this ECG.  Normal sinus rhythm, rate of 81, right bundle branch block, T-wave inversion in anterior leads which is unchanged from prior, no ST elevations.  ____________________________________________  RADIOLOGY  Head CT: No acute intracranial abnormality.  Stable  atrophy. White matter changes are suggestive for chronic small vessel ischemic disease.   CXR: Negative ____________________________________________   PROCEDURES  Procedure(s) performed: None Procedures Critical Care performed:  None ____________________________________________   INITIAL IMPRESSION / ASSESSMENT AND PLAN / ED COURSE  82 y.o. female history of dementia and multiple pelvic fractures, hypokalemia and anemia who presents for evaluation of progressively worsening altered mental status x 5 days. Patient arrives with GCS of 8 to although protecting her airway and response to painful stimuli. She is breathing normally. No response to Narcan per EMS. She is afebrile. Vital signs are within normal limits. Pupils are 3 millimeters and reactive, moving all 4 extremities. Broad differential diagnoses including intracranial hemorrhage, infection, medication side effect, electrolyte abnormalities. We'll get labs including VBG, CBC, CMP, troponin, UA. Will get EKG, chest x-ray, head CT. Patient monitored on telemetry.  Clinical Course  Comment By Time  Patient seems to be very dehydrated with GFR 34 (baseline normal GFR 60), hypernatremia Na 155. 1L NS ordered. Head CT negative. UA concerning for UTI, patient given ceftriaxone as she is very sleepy for PO meds. Will admit to Hospitalist. Nita Sicklearolina Lavontae Cornia, MD 09/26 1552    Pertinent labs & imaging results that were available during my care of the patient were reviewed by me and considered in my medical decision making (see chart for details).    ____________________________________________   FINAL CLINICAL IMPRESSION(S) / ED DIAGNOSES  Final diagnoses:  Altered mental status, unspecified altered mental status type  Dehydration  UTI (lower urinary tract infection)  AKI (acute kidney injury) (HCC)  Hypernatremia      NEW MEDICATIONS STARTED DURING THIS VISIT:  Current Discharge Medication List       Note:  This  document was prepared using Dragon voice recognition software and may include unintentional dictation errors.    Nita Sicklearolina Trinidi Toppins, MD 01/05/16 2011

## 2016-01-05 NOTE — ED Triage Notes (Signed)
[facility] bib EMS [facility] [place] w/ c/o AMS.  Per EMS, facility reported that pt hasn't been at baseline since Thursday 9/21, but "nearly slid out the chair ' today.  Pt will respond to pain/verbal stimuli, pt refuses to open eyes.  Pupils responsive, pt given 1 mg narcan intranasally w/ no change pre EMS.  MD Don PerkingVeronese at bedside, NAD.

## 2016-01-05 NOTE — Progress Notes (Signed)
Pt arrived from the ED via stretcher.

## 2016-01-06 DIAGNOSIS — L8990 Pressure ulcer of unspecified site, unspecified stage: Secondary | ICD-10-CM | POA: Insufficient documentation

## 2016-01-06 LAB — BASIC METABOLIC PANEL
ANION GAP: 10 (ref 5–15)
BUN: 50 mg/dL — ABNORMAL HIGH (ref 6–20)
CALCIUM: 8.8 mg/dL — AB (ref 8.9–10.3)
CO2: 23 mmol/L (ref 22–32)
CREATININE: 0.88 mg/dL (ref 0.44–1.00)
Chloride: 122 mmol/L — ABNORMAL HIGH (ref 101–111)
GFR, EST NON AFRICAN AMERICAN: 59 mL/min — AB (ref >60)
Glucose, Bld: 85 mg/dL (ref 65–99)
Potassium: 3.5 mmol/L (ref 3.5–5.1)
SODIUM: 155 mmol/L — AB (ref 135–145)

## 2016-01-06 LAB — URINE CULTURE: Culture: NO GROWTH

## 2016-01-06 LAB — CBC
HCT: 40.1 % (ref 35.0–47.0)
HEMOGLOBIN: 12.4 g/dL (ref 12.0–16.0)
MCH: 19.4 pg — AB (ref 26.0–34.0)
MCHC: 31 g/dL — ABNORMAL LOW (ref 32.0–36.0)
MCV: 62.5 fL — ABNORMAL LOW (ref 80.0–100.0)
PLATELETS: 292 10*3/uL (ref 150–440)
RBC: 6.41 MIL/uL — AB (ref 3.80–5.20)
RDW: 17.7 % — ABNORMAL HIGH (ref 11.5–14.5)
WBC: 7.8 10*3/uL (ref 3.6–11.0)

## 2016-01-06 LAB — MRSA PCR SCREENING: MRSA BY PCR: NEGATIVE

## 2016-01-06 MED ORDER — ENOXAPARIN SODIUM 40 MG/0.4ML ~~LOC~~ SOLN
40.0000 mg | SUBCUTANEOUS | Status: DC
  Administered 2016-01-06 – 2016-01-07 (×2): 40 mg via SUBCUTANEOUS
  Filled 2016-01-06 (×3): qty 0.4

## 2016-01-06 MED ORDER — POTASSIUM CL IN DEXTROSE 5% 20 MEQ/L IV SOLN
20.0000 meq | INTRAVENOUS | Status: DC
  Administered 2016-01-06 – 2016-01-08 (×4): 20 meq via INTRAVENOUS
  Filled 2016-01-06 (×5): qty 1000

## 2016-01-06 NOTE — Plan of Care (Signed)
Problem: SLP Dysphagia Goals Goal: Misc Dysphagia Goal Pt will safely tolerate po diet of least restrictive consistency w/ no overt s/s of aspiration noted by Staff/pt/family x3 sessions.    

## 2016-01-06 NOTE — Progress Notes (Signed)
[facility] [place] at [facility]   PATIENT NAME: Michelle Ballard    MR#:  [id number]  DATE OF BIRTH:  07-10-1930  SUBJECTIVE:  CHIEF COMPLAINT:   Chief Complaint  Patient presents with  . Altered Mental Status   More awake today. Continues to be confused at baseline dementia.  REVIEW OF SYSTEMS:    ROS  Cannot obtain due to dementia  DRUG ALLERGIES:  No Known Allergies  VITALS:  Blood pressure (!) 146/55, pulse 74, temperature 97.5 F (36.4 C), temperature source Oral, resp. rate 18, height 4\' 11"  (1.499 m), weight 49 kg (108 lb), SpO2 100 %.  PHYSICAL EXAMINATION:   Physical Exam  GENERAL:  82 y.o.-year-old patient lying in the bed with no acute distress.  EYES: Pupils equal, round, reactive to light and accommodation. No scleral icterus. Extraocular muscles intact.  HEENT: Head atraumatic, normocephalic. Oropharynx and nasopharynx clear.  NECK:  Supple, no jugular venous distention. No thyroid enlargement, no tenderness.  LUNGS: Normal breath sounds bilaterally, no wheezing, rales, rhonchi. No use of accessory muscles of respiration.  CARDIOVASCULAR: S1, S2 normal. No murmurs, rubs, or gallops.  ABDOMEN: Soft, nontender, nondistended. Bowel sounds present. No organomegaly or mass.  EXTREMITIES: No cyanosis, clubbing or edema b/l.    NEUROLOGIC: Moves all 4 extremities PSYCHIATRIC: The patient is alert and Awake. Pleasantly confused SKIN: No obvious rash, lesion, or ulcer.   LABORATORY PANEL:   CBC  Recent Labs Lab 01/06/16 0517  WBC 7.8  HGB 12.4  HCT 40.1  PLT 292   ------------------------------------------------------------------------------------------------------------------ Chemistries   Recent Labs Lab 01/05/16 1427 01/06/16 0517  NA 155* 155*  K 3.8 3.5  CL 114* 122*  CO2 29 23  GLUCOSE 105* 85  BUN 59* 50*  CREATININE 1.40* 0.88  CALCIUM 10.0 8.8*  AST 30  --   ALT 21  --   ALKPHOS 98  --   BILITOT 1.0  --     ------------------------------------------------------------------------------------------------------------------  Cardiac Enzymes  Recent Labs Lab 01/05/16 1427  TROPONINI <0.03   ------------------------------------------------------------------------------------------------------------------  RADIOLOGY:  Ct Head Wo Contrast  Result Date: 01/05/2016 CLINICAL DATA:  Altered mental status. EXAM: CT HEAD WITHOUT CONTRAST TECHNIQUE: Contiguous axial images were obtained from the base of the skull through the vertex without intravenous contrast. COMPARISON:  11/13/2015 FINDINGS: Brain: Stable cerebral atrophy. Stable low-density in the periventricular white matter. No evidence for acute hemorrhage, mass lesion, midline shift, hydrocephalus or large infarct. Vascular: No hyperdense vessel or unexpected calcification. Skull: Normal. Negative for fracture or focal lesion. Sinuses/Orbits: Small amount of fluid in the right mastoid air cells. Mucosal disease in a posterior right ethmoid air cell. Other: None. IMPRESSION: No acute intracranial abnormality. Stable atrophy. White matter changes are suggestive for chronic small vessel ischemic disease. Electronically Signed   By: Richarda Overlie M.D.   On: 01/05/2016 14:57   Dg Chest Port 1 View  Result Date: 01/05/2016 CLINICAL DATA:  Acute mental status change EXAM: PORTABLE CHEST 1 VIEW COMPARISON:  November 13, 2015 FINDINGS: The heart size borderline. The hila and mediastinum are unremarkable. No pneumothorax. No pulmonary nodules, masses, or focal infiltrates. IMPRESSION: No active disease. Electronically Signed   By: Gerome Sam III M.D   On: 01/05/2016 16:04     ASSESSMENT AND PLAN:   * Acute encephalopathy over baseline dementia due to UTI and hypernatremia Improving. Patient is still confused  * UTI Continue IV ceftriaxone and wait for culture results.  * Severe Hypernatremia due to  poor oral intake and dehydration   will change  half-normal saline to D5. Repeat labs.   All the records are reviewed and case discussed with Care Management/Social Workerr. Management plans discussed with the patient, family and they are in agreement.  CODE STATUS: Full code  DVT Prophylaxis: SCDs  TOTAL TIME TAKING CARE OF THIS PATIENT: 30 minutes.   POSSIBLE D/C IN 2-3 DAYS, DEPENDING ON CLINICAL CONDITION.  Milagros LollSudini, Jan Walters R M.D on 01/06/2016 at 1:19 PM  Between 7am to 6pm - Pager - [phone]  After 6pm go to [email or web address] - password [email or web address]  Fabio [facility]  Office  [phone]  CC: Primary care physician; Barbette ReichmannHANDE,VISHWANATH, MD  Note: This dictation was prepared with Dragon dictation along with smaller phrase technology. Any transcriptional errors that result from this process are unintentional.

## 2016-01-06 NOTE — Plan of Care (Signed)
Problem: Education: Goal: Knowledge of [facility] Education information/materials will improve Outcome: Not Met (add Reason) Unable to educate pt due to altered mental status.

## 2016-01-06 NOTE — Clinical Social Work Note (Signed)
Patient is a resident of [facility]. CSW has spoken with the administrator, Kendal [facility]Bonnie, at [facility] and they can take patient back when time for discharge. CSW to complete full assessment and speak with patient's family. York SpanielMonica Victor Langenbach MSW,LCSW [phone]

## 2016-01-06 NOTE — Evaluation (Signed)
Clinical/Bedside Swallow Evaluation Patient Details  Name: Michelle Ballard MRN: [id number] Date of Birth: 03/19/1931  Today's Date: 01/06/2016 Time: SLP Start Time (ACUTE ONLY): 1000 SLP Stop Time (ACUTE ONLY): 1100 SLP Time Calculation (min) (ACUTE ONLY): 60 min  Past Medical History:  Past Medical History:  Diagnosis Date  . Dementia   . Multiple pelvic fractures [facility])    Past Surgical History:  Past Surgical History:  Procedure Laterality Date  . NO PAST SURGERIES     HPI:  Pt is a 82 y.o. female with a known history of Dementia, fractures on pelvis- lives at NH- had UTI and was on cipro in last 2 weeks, But Cx result shows - E coli resistant to cipro. She was seen in ER last week for pain- and was given oxycodon. She was not eating well for last 4-5 days, and lethargic, but at NH they kept giving her Lorazepam and Oxycodon with apple sauce. In ER today, (9/26), noted dehydrated, UTI, and altered mental status. Pt awake, verbally conversive w/ SLP today; moderate-severe Confusion noted during verbal interaction; pleasant though.    Assessment / Plan / Recommendation Clinical Impression  Pt adequaltely tolerated PO trials of thin liquids and puree with no immediate, overt signs of aspiration following strict aspiration precautions.  Pt was oriented to self, but was not able to tell ST services where she was nor what year it was. Pt was able to relay the city she lived in and some information on her family. Pt was easily distracted and required moderate cues to attend to task of PO trials. Pt exhibited delayed throat clear intermittently during PO trials of thin liquids - suspect related to a larger bolus and distraction and use of straw. Chest xray shows no abnormalities and no overt s/s of aspiration. ST services recommends a Dysphagia 1 diet with Thin Liquids, no straws, following strict aspiration precaustions. Due to Pt's cognititve decline, strict monitoring, redirection and moderate  verbal and tactile cues in order to refocus are necessary to ensure a safe swallow. ST services will strictly monitor diet recommendation, as Pt may need nectar thick consistency if throat clearing continues to ensure safer PO intake.     Aspiration Risk  Mild aspiration risk    Diet Recommendation Dysphagia 1 (Puree);Thin liquid, Aspiration Precautions  Liquid Administration via: Cup;No straw Medication Administration: Crushed with puree (As crushable) Supervision: Full supervision/cueing for compensatory strategies;Staff to assist with self feeding Compensations: Minimize environmental distractions;Slow rate;Small sips/bites;Follow solids with liquid Postural Changes: Seated upright at 90 degrees;Remain upright for at least 30 minutes after po intake    Other  Recommendations Recommended Consults:  (Dietician) Oral Care Recommendations: Oral care BID;Staff/trained caregiver to provide oral care   Follow up Recommendations Skilled Nursing facility (TBD)      Frequency and Duration min 3x week  2 weeks       Prognosis Prognosis for Safe Diet Advancement: Fair (-Good) Barriers to Reach Goals: Cognitive deficits      Swallow Study   General Date of Onset: 01/05/16 HPI: Pt is a 82 y.o. female with a known history of Dementia, fractures on pelvis- lives at NH- had UTI and was on cipro in last 2 weeks, But Cx result shows - E coli resistant to cipro. She was seen in ER last week for pain- and was given oxycodon. She was not eating well for last 4-5 days, and lethargic, but at NH they kept giving her Lorazepam and Oxycodon with apple sauce. In ER  today, (9/26), noted dehydrated, UTI, and altered mental status. Pt awake, verbally conversive w/ SLP today; moderate-severe Confusion noted during verbal interaction; pleasant though.  Type of Study: Bedside Swallow Evaluation Previous Swallow Assessment: None indicated Diet Prior to this Study: Regular;Thin liquids (Clear Liquid upon  admission) Temperature Spikes Noted: No (WBC 7.8) Respiratory Status: Nasal cannula (2 L) History of Recent Intubation: No Behavior/Cognition: Alert;Cooperative;Pleasant mood;Confused;Requires cueing (Baseline Dementia) Oral Cavity Assessment: Within Functional Limits Oral Care Completed by SLP: Yes Oral Cavity - Dentition: Dentures, top;Dentures, bottom (Min loose) Vision: Functional for self-feeding Self-Feeding Abilities: Total assist;Needs set up Patient Positioning: Upright in bed Baseline Vocal Quality: Normal Volitional Cough: Strong Volitional Swallow: Able to elicit    Oral/Motor/Sensory Function Overall Oral Motor/Sensory Function: Within functional limits   Ice Chips Ice chips: Not tested   Thin Liquid Thin Liquid: Impaired Presentation: Cup;Straw (5 trials) Oral Phase Impairments:  (none) Oral Phase Functional Implications:  (none) Pharyngeal  Phase Impairments: Throat Clearing - Delayed (x2) Other Comments: Pt exhibited delayed throat clear when using straw x1 and when drinking from cup x1    Nectar Thick Nectar Thick Liquid: Not tested   Honey Thick Honey Thick Liquid: Not tested   Puree Puree: Within functional limits Presentation: Spoon (7 trials)   Solid   GO   Solid: Not tested        Altamese DillingLauren Muller, B.A. Clinical Graduate Student 01/06/2016,1:14 PM   This information has been reviewed and agreed upon by this supervising clinician.  Jerilynn SomKatherine Emre Stock, MS, CCC-SLP

## 2016-01-07 LAB — BASIC METABOLIC PANEL
ANION GAP: 6 (ref 5–15)
BUN: 31 mg/dL — ABNORMAL HIGH (ref 6–20)
CO2: 27 mmol/L (ref 22–32)
Calcium: 8.4 mg/dL — ABNORMAL LOW (ref 8.9–10.3)
Chloride: 113 mmol/L — ABNORMAL HIGH (ref 101–111)
Creatinine, Ser: 0.63 mg/dL (ref 0.44–1.00)
GFR calc Af Amer: >60 mL/min (ref >60)
Glucose, Bld: 110 mg/dL — ABNORMAL HIGH (ref 65–99)
POTASSIUM: 3.1 mmol/L — AB (ref 3.5–5.1)
SODIUM: 146 mmol/L — AB (ref 135–145)

## 2016-01-07 LAB — BLOOD GAS, VENOUS
ACID-BASE EXCESS: 5.8 mmol/L — AB (ref 0.0–2.0)
BICARBONATE: 32.5 mmol/L — AB (ref 20.0–28.0)
PATIENT TEMPERATURE: 37
PH VEN: 7.38 (ref 7.250–7.430)
pCO2, Ven: 55 mmHg (ref 44.0–60.0)
pO2, Ven: <31 mmHg — CL (ref 32.0–45.0)

## 2016-01-07 LAB — ALBUMIN: ALBUMIN: 3.1 g/dL — AB (ref 3.5–5.0)

## 2016-01-07 MED ORDER — POTASSIUM CHLORIDE 20 MEQ PO PACK
40.0000 meq | PACK | Freq: Two times a day (BID) | ORAL | Status: AC
  Administered 2016-01-07 (×2): 40 meq via ORAL
  Filled 2016-01-07 (×2): qty 2

## 2016-01-07 MED ORDER — ENSURE ENLIVE PO LIQD
237.0000 mL | Freq: Two times a day (BID) | ORAL | Status: DC
  Administered 2016-01-07 – 2016-01-08 (×2): 237 mL via ORAL

## 2016-01-07 NOTE — Progress Notes (Signed)
[facility] - [place] at [facility]   PATIENT NAME: Michelle Ballard    MR#:  [id number]  DATE OF BIRTH:  01-30-1931  SUBJECTIVE:  CHIEF COMPLAINT:   Chief Complaint  Patient presents with  . Altered Mental Status   More awake today. Continues to be confused at baseline dementia.  no complains.  REVIEW OF SYSTEMS:    ROS  Cannot obtain due to dementia  DRUG ALLERGIES:  No Known Allergies  VITALS:  Blood pressure (!) 147/63, pulse 61, temperature 98.3 F (36.8 C), temperature source Oral, resp. rate 16, height 4\' 11"  (1.499 m), weight 49 kg (108 lb), SpO2 97 %.  PHYSICAL EXAMINATION:   Physical Exam  GENERAL:  82 y.o.-year-old patient lying in the bed with no acute distress.  EYES: Pupils equal, round, reactive to light and accommodation. No scleral icterus. Extraocular muscles intact.  HEENT: Head atraumatic, normocephalic. Oropharynx and nasopharynx clear.  NECK:  Supple, no jugular venous distention. No thyroid enlargement, no tenderness.  LUNGS: Normal breath sounds bilaterally, no wheezing, rales, rhonchi. No use of accessory muscles of respiration.  CARDIOVASCULAR: S1, S2 normal. No murmurs, rubs, or gallops.  ABDOMEN: Soft, nontender, nondistended. Bowel sounds present. No organomegaly or mass.  EXTREMITIES: No cyanosis, clubbing or edema b/l.    NEUROLOGIC: Moves all 4 extremities PSYCHIATRIC: The patient is alert and Awake. Pleasantly confused SKIN: No obvious rash, lesion, or ulcer.   LABORATORY PANEL:   CBC  Recent Labs Lab 01/06/16 0517  WBC 7.8  HGB 12.4  HCT 40.1  PLT 292   ------------------------------------------------------------------------------------------------------------------ Chemistries   Recent Labs Lab 01/05/16 1427  01/07/16 0453  NA 155*  < > 146*  K 3.8  < > 3.1*  CL 114*  < > 113*  CO2 29  < > 27  GLUCOSE 105*  < > 110*  BUN 59*  < > 31*  CREATININE 1.40*  < > 0.63  CALCIUM 10.0  < > 8.4*  AST  30  --   --   ALT 21  --   --   ALKPHOS 98  --   --   BILITOT 1.0  --   --   < > = values in this interval not displayed. ------------------------------------------------------------------------------------------------------------------  Cardiac Enzymes  Recent Labs Lab 01/05/16 1427  TROPONINI <0.03   ------------------------------------------------------------------------------------------------------------------  RADIOLOGY:  No results found.   ASSESSMENT AND PLAN:   * Acute encephalopathy over baseline dementia due to UTI and hypernatremia Improving. Patient is still confused  * UTI Continue IV ceftriaxone , no growth on cultures.  * Severe Hypernatremia due to poor oral intake and dehydration   on D5 IV, improved sodium level.  * Hypokalemia   Replace IV and oral, recheck tomorrow.  All the records are reviewed and case discussed with Care Management/Social Workerr. Management plans discussed with the patient, family and they are in agreement.  CODE STATUS: Full code  DVT Prophylaxis: SCDs  TOTAL TIME TAKING CARE OF THIS PATIENT: 30 minutes.   POSSIBLE D/C IN 2-3 DAYS, DEPENDING ON CLINICAL CONDITION. Possible d/c tomorrow, once potassium level improves.  Altamese DillingVACHHANI, Ata Pecha M.D on 01/07/2016 at 4:32 PM  Between 7am to 6pm - Pager - [phone].  After 6pm go to [email or web address] - password [email or web address]  [facility]  Office  [phone]  CC: Primary care physician; Barbette ReichmannHANDE,VISHWANATH, MD  Note: This dictation was prepared with Dragon dictation along with smaller phrase technology. Any transcriptional errors that result from this process are  unintentional.

## 2016-01-07 NOTE — Progress Notes (Signed)
Initial Nutrition Assessment  DOCUMENTATION CODES:   Not applicable  INTERVENTION:  -Recommend addition of Magic Cup on meal tray, Ensure Enlive po BID between meals -Feeding assistance and encouragement of meals and supplements  NUTRITION DIAGNOSIS:   Inadequate oral intake related to lethargy/confusion, acute illness, chronic illness as evidenced by meal completion < 25%.  GOAL:   Patient will meet greater than or equal to 90% of their needs  MONITOR:   PO intake, Supplement acceptance, Labs, Weight trends  REASON FOR ASSESSMENT:   Consult Poor PO  ASSESSMENT:   82 yo female admitted with acute encephalopathy with baseline dementia due to UTI and hypernatremia  Pt very confused on visit today, not able to answer questions appropriately. Pt declined physical exam on visit today.   Pt reports she did not eat anything today, recorded po intake 0%.   Per weight encounters, 16% wt loss in 1 month. Request new weight.   Unable to complete Nutrition-Focused physical exam at this time.    Past Medical History:  Diagnosis Date  . Dementia   . Multiple pelvic fractures (HCC)     Diet Order:  DIET - DYS 1 Room service appropriate? No; Fluid consistency: Thin  Skin:  Wound (see comment) (stage II sacrum)  Last BM:  9/28   Labs: sodium 146 (improved), potassium 3.1  Meds: D5 with KCl at 75 ml/hr, potassium chloride  Height:   Ht Readings from Last 1 Encounters:  01/05/16 4\' 11"  (1.499 m)    Weight:   Wt Readings from Last 1 Encounters:  01/05/16 108 lb (49 kg)    Filed Weights   01/05/16 1422 01/05/16 1914  Weight: 130 lb (59 kg) 108 lb (49 kg)   Wt Readings from Last 10 Encounters:  01/05/16 108 lb (49 kg)  12/26/15 130 lb (59 kg)  12/07/15 129 lb 14.4 oz (58.9 kg)  11/30/15 130 lb 8 oz (59.2 kg)  11/26/15 126 lb (57.2 kg)  11/13/15 145 lb (65.8 kg)  10/21/15 142 lb (64.4 kg)    BMI:  Body mass index is 21.81 kg/m.  Estimated Nutritional  Needs:   Kcal:  1300-1600 kcals  Protein:  60-75 g  Fluid:  >/= 1.3 L  EDUCATION NEEDS:   No education needs identified at this time  Romelle StarcherCate Kailoni Vahle MS, RD, LDN [phone] Pager  [phone] Weekend/On-Call Pager

## 2016-01-07 NOTE — Consult Note (Signed)
   [facility]   01/07/2016  Edsel Petrinlna M Schaper Oct 09, 1930 [id number]   Patient screened for potential [facility] Care Management services. Patient is eligible for [facility] Services. Electronic medical record reveals patient's discharge plan is to go back to SNF where she is a resident. [facility] services not appropriate at this time. If patient's post hospital needs change please place a [facility] Care Management consult. For questions please contact:   Lasha Echeverria RN, BSN [facility]  Hospital Liaison  [phone]) Business Mobile [phone]) Toll free office

## 2016-01-07 NOTE — Clinical Social Work Note (Signed)
Clinical Social Work Assessment  Patient Details  Name: Edsel Petrinlna M Graf MRN: [id number] Date of Birth: Aug 12, 1930  Date of referral:  01/07/16               Reason for consult:  Facility Placement                Permission sought to share information with:   (patient with dementia) Permission granted to share information::     Name::        Agency::     Relationship::     Contact Information:     Housing/Transportation Living arrangements for the past 2 months:  Assisted Living Facility Source of Information:  Adult Children Patient Interpreter Needed:  None Criminal Activity/Legal Involvement Pertinent to Current Situation/Hospitalization:  No - Comment as needed Significant Relationships:  Adult Children Lives with:  Facility Resident Do you feel safe going back to the place where you live?  Yes Need for family participation in patient care:  No (Coment)  Care giving concerns:  [facility]   Social Worker assessment / plan:  CSW spoke with patient's son: Fayrene FearingJames Kitner: [phone]. Mr. Laurance FlattenScruggs and his brother: Barron SchmidJohn Vaccarella are very involved in their mother's care and attend physician appointments with her as well. Fayrene FearingJames was stating that he does wish for patient to [facility] at [facility]. He voiced some concerns with the fact that patient was seen by her MD the day prior to her hospital admission and that they did not recommend at that time that patient be admitted to the hospital. He also vocalized some concern with [facility] and why they did not act as quickly with seeking care. Fayrene FearingJames states that he is very grateful to [facility]nie [facility] due to the fact that his mother has had to be moved several times and due to her behaviors was not able to return [facility]. He stated that Kendal HymenBonnie at [facility] knew his mother from church and was willing to work with her. Fayrene FearingJames believes that [facility] is doing the best that they can. He did  inquire about the fact that patient had an appointment with Dr. Rosita KeaMenz for a possible injection to assist with pain of one of her vertebral fractures while it heals and that being here would mean she would miss that appointment. He wanted to request that the appointment be rescheduled.   Employment status:  Retired Database administratornsurance information:  Managed Medicare PT Recommendations:  Not assessed at this time Information / Referral to community resources:     Patient/Family's Response to care:  Patient's son expressed appreciation for CSW assistance.  Patient/Family's Understanding of and Emotional Response to Diagnosis, Current Treatment, and Prognosis:  Patient's son expressed that he was happy that his mother showed some improvement last evening and was no longer as sedated and was back to her "fiesty" self.   Emotional Assessment Appearance:  Appears stated age Attitude/Demeanor/Rapport:  Unable to Assess Affect (typically observed):  Quiet, Calm Orientation:  Oriented to Self Alcohol / Substance use:  Not Applicable Psych involvement (Current and /or in the community):  No (Comment)  Discharge Needs  Concerns to be addressed:  Care Coordination Readmission within the last 30 days:  No Current discharge risk:  None Barriers to Discharge:  No Barriers Identified   York SpanielMonica Huston Stonehocker, LCSW 01/07/2016, 2:06 PM

## 2016-01-08 LAB — BASIC METABOLIC PANEL
ANION GAP: 8 (ref 5–15)
BUN: 18 mg/dL (ref 6–20)
CHLORIDE: 112 mmol/L — AB (ref 101–111)
CO2: 24 mmol/L (ref 22–32)
CREATININE: 0.58 mg/dL (ref 0.44–1.00)
Calcium: 8.7 mg/dL — ABNORMAL LOW (ref 8.9–10.3)
GFR calc non Af Amer: >60 mL/min (ref >60)
Glucose, Bld: 99 mg/dL (ref 65–99)
POTASSIUM: 3.5 mmol/L (ref 3.5–5.1)
SODIUM: 144 mmol/L (ref 135–145)

## 2016-01-08 MED ORDER — ENSURE ENLIVE PO LIQD: 237.0000 mL | Freq: Two times a day (BID) | ORAL | 12 refills | Status: DC

## 2016-01-08 MED ORDER — CEFUROXIME AXETIL 250 MG PO TABS: 250.0000 mg | ORAL_TABLET | Freq: Two times a day (BID) | ORAL | 0 refills | Status: DC

## 2016-01-08 MED ORDER — HYDROCODONE-ACETAMINOPHEN 5-325 MG PO TABS: 0.5000 | ORAL_TABLET | Freq: Four times a day (QID) | ORAL | 0 refills | Status: DC | PRN

## 2016-01-08 NOTE — Progress Notes (Signed)
Pt to be discharged per MD order. IV removed. [facility]. [facility]. [facility] has their own scheduled transport to arrive at 1530.

## 2016-01-08 NOTE — Evaluation (Signed)
Physical Therapy Evaluation Patient Details Name: Michelle Ballard MRN: [id number] DOB: 1930-07-18 Today's Date: 01/08/2016   History of Present Illness  Pt is a 82 y.o. female with a known history of Dementia, fractures on pelvis- [facility]- had UTI and was on cipro in last 2 weeks, But Cx result shows - E coli resistant to cipro. She was seen in ER last week for pain- and was given oxycodon. She was not eating well for last 4-5 days, and lethargic, but at NH they kept giving her Lorazepam and Oxycodon with apple sauce. In ER today, (9/26), noted dehydrated, UTI, and altered mental status. Pt awake, verbally conversive w/ SLP today; moderate-severe Confusion noted during verbal interaction; pleasant though.    Clinical Impression  Pt presents with deficits in strength, gait, mobility, transfers, and balance.  Pt required mod A with bed mobility and transfers with max verbal and tactile cues for sequencing.  Pt was able to do some marching in place in standing at EOB with RW with visual and verbal cues but would not take a step forward away from the bed and was anxious during standing activities.  Pt will benefit from continued PT services to address above deficits for decreased caregiver assistance upon discharge.      Follow Up Recommendations Home health PT    Equipment Recommendations  Rolling walker with 5" wheels    Recommendations for Other Services       Precautions / Restrictions Precautions Precautions: Fall Restrictions Weight Bearing Restrictions: No      Mobility  Bed Mobility Overal bed mobility: Needs Assistance Bed Mobility: Rolling;Supine to Sit;Sit to Supine Rolling: Mod assist   Supine to sit: Mod assist Sit to supine: Mod assist   General bed mobility comments: Max verbal and tactile cues for sequencing  Transfers Overall transfer level: Needs assistance Equipment used: Rolling walker (2 wheeled) Transfers: Sit to/from Stand Sit to Stand: Mod assist          General transfer comment: Max verbal and tactile cues for hand placement, sequencing  Ambulation/Gait             General Gait Details: Pt able to do some marching in place but was unable to get pt to amb forward away from EOB secondary to cognition.  Stairs            Wheelchair Mobility    Modified Rankin (Stroke Patients Only)       Balance Overall balance assessment: Needs assistance   Sitting balance-Leahy Scale: Fair     Standing balance support: Bilateral upper extremity supported Standing balance-Leahy Scale: Fair                               Pertinent Vitals/Pain Pain Assessment: No/denies pain    Home Living Family/patient expects to be discharged to:: Assisted living/memory care                 Additional Comments: Pt on memory care unit    Prior Function Level of Independence: Needs assistance         Comments: difficult to really assess how much she was doing, pt very confused     Hand Dominance        Extremity/Trunk Assessment   Upper Extremity Assessment: Difficult to assess due to impaired cognition           Lower Extremity Assessment: Difficult to assess due to impaired cognition  Communication      Cognition Arousal/Alertness: Awake/alert Behavior During Therapy: WFL for tasks assessed/performed Overall Cognitive Status: No family/caregiver present to determine baseline cognitive functioning                      General Comments      Exercises Total Joint Exercises Ankle Circles/Pumps: AAROM;Both;10 reps Heel Slides: AAROM;Both;10 reps;15 reps Hip ABduction/ADduction: AAROM;Both;10 reps;15 reps Straight Leg Raises: AAROM;Both;10 reps Long Arc Quad: AAROM;Both;10 reps Knee Flexion: AAROM;Both;10 reps Marching in Standing: AROM;Both;5 reps;10 reps   Assessment/Plan    PT Assessment Patient needs continued PT services  PT Problem List Decreased balance;Decreased  mobility;Decreased strength          PT Treatment Interventions DME instruction;Gait training;Functional mobility training;Therapeutic activities;Therapeutic exercise;Balance training;Neuromuscular re-education;Patient/family education    PT Goals (Current goals can be found in the Care Plan section)  Acute Rehab PT Goals PT Goal Formulation: Patient unable to participate in goal setting Time For Goal Achievement: 01/21/16 Potential to Achieve Goals: Fair    Frequency Min 2X/week   Barriers to discharge        Co-evaluation               End of Session Equipment Utilized During Treatment: Gait belt Activity Tolerance: Patient tolerated treatment well Patient left: in bed;with bed alarm set;with call bell/phone within reach Nurse Communication: Mobility status         Time: 1610-9604 PT Time Calculation (min) (ACUTE ONLY): 33 min   Charges:   PT Evaluation $PT Eval Low Complexity: 1 Procedure PT Treatments $Therapeutic Exercise: 8-22 mins   PT G Codes:        DElly Modena PT, DPT 01/08/16, 1:17 PM

## 2016-01-08 NOTE — NC FL2 (Signed)
[place] MEDICAID FL2 LEVEL OF CARE SCREENING TOOL     IDENTIFICATION  Patient Name: Michelle Ballard Birthdate: 10-10-1930 Sex: female Admission Date (Current Location): 01/05/2016  [facility] and [place] Number:  Chiropodist and Address:  [facility], [address] [place], [place] [address]      Provider Number: [id number]  Attending Physician Name and Address:  Altamese Dilling, MD  Relative Name and Phone Number:       Current Level of Care: Hospital Recommended Level of Care: Assisted Living Facility Prior Approval Number:    Date Approved/Denied:   PASRR Number:    Discharge Plan:  (ALF)    Current Diagnoses: Patient Active Problem List   Diagnosis Date Noted  . Pressure injury of skin 01/06/2016  . UTI (lower urinary tract infection) 01/05/2016  . Dehydration 01/05/2016  . Altered mental status 01/05/2016  . Dizziness 12/04/2015  . Vitamin B12 deficiency 12/04/2015  . Hypokalemia 11/16/2015  . Anemia 11/16/2015  . Dementia with behavioral disturbance 11/16/2015  . Pelvic fracture, closed, initial encounter 11/13/2015    Orientation RESPIRATION BLADDER Height & Weight     Self  Normal Incontinent Weight: 108 lb (49 kg) Height:  4\' 11"  (149.9 cm)  BEHAVIORAL SYMPTOMS/MOOD NEUROLOGICAL BOWEL NUTRITION STATUS   (none)  (none) Incontinent Diet (dysphagia 1)  AMBULATORY STATUS COMMUNICATION OF NEEDS Skin   Limited Assist Verbally PU Stage and Appropriate Care                       Personal Care Assistance Level of Assistance  Bathing, Dressing Bathing Assistance: Limited assistance   Dressing Assistance: Limited assistance     Functional Limitations Info             SPECIAL CARE FACTORS FREQUENCY                       Contractures Contractures Info: Not present    Additional Factors Info  Code Status Code Status Info: full             DISCHARGE MEDICATIONS:       Current  Discharge Medication List        START taking these medications   Details  cefUROXime (CEFTIN) 250 MG tablet Take 1 tablet (250 mg total) by mouth 2 (two) times daily with a meal. X 4 days Qty: 8 tablet, Refills: 0    feeding supplement, ENSURE ENLIVE, (ENSURE ENLIVE) LIQD Take 237 mLs by mouth 2 (two) times daily between meals. Qty: 237 mL, Refills: 12          CONTINUE these medications which have CHANGED   Details  HYDROcodone-acetaminophen (NORCO/VICODIN) 5-325 MG tablet Take 0.5 tablets by mouth every 6 (six) hours as needed for moderate pain or severe pain. Qty: 10 tablet, Refills: 0          CONTINUE these medications which have NOT CHANGED   Details  acetaminophen (TYLENOL) 650 MG CR tablet Take 650 mg by mouth every 8 (eight) hours.    lidocaine (LIDODERM) 5 % Place 1 patch onto the skin every 12 (twelve) hours. Remove & Discard patch within 12 hours or as directed by MD Qty: 10 patch, Refills: 0    megestrol (MEGACE) 40 MG tablet Take 40 mg by mouth daily.    senna (SENOKOT) 8.6 MG TABS tablet Take 1 tablet (8.6 mg total) by mouth daily. Qty: 30 each, Refills: 0  polyethylene glycol (MIRALAX / GLYCOLAX) packet Take 17 g by mouth daily as needed for mild constipation. Qty: 14 each, Refills: 0         STOP taking these medications     LORazepam (ATIVAN) 1 MG tablet      oxyCODONE (ROXICODONE) 5 MG immediate release tablet      LORazepam (ATIVAN) 0.5 MG tablet      potassium chloride (K-DUR) 10 MEQ tablet          Relevant Imaging Results:  Relevant Lab Results:   Additional Information    York SpanielMonica Hadrian Yarbrough, LCSW

## 2016-01-08 NOTE — Discharge Summary (Signed)
[facility] - [place] at [facility]   PATIENT NAME: Michelle Ballard    MR#:  [id number]  DATE OF BIRTH:  05/02/1930  DATE OF ADMISSION:  01/05/2016 ADMITTING PHYSICIAN: Altamese Dilling, MD  DATE OF DISCHARGE: 01/08/2016   PRIMARY CARE PHYSICIAN: Barbette Reichmann, MD    ADMISSION DIAGNOSIS:  Dehydration [E86.0] Hypernatremia [E87.0] UTI (lower urinary tract infection) [N39.0] AKI (acute kidney injury) (HCC) [N17.9] Potential for altered mental status [Z91.89] Altered mental status, unspecified altered mental status type [R41.82]  DISCHARGE DIAGNOSIS:  Principal Problem:   UTI (lower urinary tract infection) Active Problems:   Dehydration   Altered mental status   Pressure injury of skin   SECONDARY DIAGNOSIS:   Past Medical History:  Diagnosis Date  . Dementia   . Multiple pelvic fractures [facility])     HOSPITAL COURSE:   * Acute encephalopathy over baseline dementia due to UTI and hypernatremia Improving. Patient is still confused- have baseline dementia.  * UTI Continue IV ceftriaxone , no growth on cultures.  * Severe Hypernatremia due to poor oral intake and dehydration  on D5 IV, improved sodium level.  * Hypokalemia   Replace IV and oral,better now.  DISCHARGE CONDITIONS:   Stable.  CONSULTS OBTAINED:    DRUG ALLERGIES:  No Known Allergies  DISCHARGE MEDICATIONS:   Current Discharge Medication List    START taking these medications   Details  cefUROXime (CEFTIN) 250 MG tablet Take 1 tablet (250 mg total) by mouth 2 (two) times daily with a meal. X 4 days Qty: 8 tablet, Refills: 0    feeding supplement, ENSURE ENLIVE, (ENSURE ENLIVE) LIQD Take 237 mLs by mouth 2 (two) times daily between meals. Qty: 237 mL, Refills: 12      CONTINUE these medications which have CHANGED   Details  HYDROcodone-acetaminophen (NORCO/VICODIN) 5-325 MG tablet Take 0.5 tablets by mouth every 6 (six) hours as needed for moderate  pain or severe pain. Qty: 10 tablet, Refills: 0      CONTINUE these medications which have NOT CHANGED   Details  acetaminophen (TYLENOL) 650 MG CR tablet Take 650 mg by mouth every 8 (eight) hours.    lidocaine (LIDODERM) 5 % Place 1 patch onto the skin every 12 (twelve) hours. Remove & Discard patch within 12 hours or as directed by MD Qty: 10 patch, Refills: 0    megestrol (MEGACE) 40 MG tablet Take 40 mg by mouth daily.    senna (SENOKOT) 8.6 MG TABS tablet Take 1 tablet (8.6 mg total) by mouth daily. Qty: 30 each, Refills: 0    polyethylene glycol (MIRALAX / GLYCOLAX) packet Take 17 g by mouth daily as needed for mild constipation. Qty: 14 each, Refills: 0      STOP taking these medications     LORazepam (ATIVAN) 1 MG tablet      oxyCODONE (ROXICODONE) 5 MG immediate release tablet      LORazepam (ATIVAN) 0.5 MG tablet      potassium chloride (K-DUR) 10 MEQ tablet          DISCHARGE INSTRUCTIONS:    Follow with PMD in 1-2 weeks.  If you experience worsening of your admission symptoms, develop shortness of breath, life threatening emergency, suicidal or homicidal thoughts you must seek medical attention immediately by calling 911 or calling your MD immediately  if symptoms less severe.  You Must read complete instructions/literature along with all the possible adverse reactions/side effects for all the Medicines you take and that have been  prescribed to you. Take any new Medicines after you have completely understood and accept all the possible adverse reactions/side effects.   Please note  You were cared for by a hospitalist during your hospital stay. If you have any questions about your discharge medications or the care you received while you were in the hospital after you are discharged, you can call the unit and asked to speak with the hospitalist on call if the hospitalist that took care of you is not available. Once you are discharged, your primary care  physician will handle any further medical issues. Please note that NO REFILLS for any discharge medications will be authorized once you are discharged, as it is imperative that you return to your primary care physician (or establish a relationship with a primary care physician if you do not have one) for your aftercare needs so that they can reassess your need for medications and monitor your lab values.    Today   CHIEF COMPLAINT:   Chief Complaint  Patient presents with  . Altered Mental Status    HISTORY OF PRESENT ILLNESS:  Michelle Ballard  is a 82 y.o. female with a known history of Dementia, fractures on pelvis- lives at NH- had UTI and was on cipro in last 2 weeks, But Cx result shows - E coli resistant to cipro. She was seen in ER last week for pain- and was given oxycodon. She was not eating good for last 4-5 days, and lethargic, but at NH they kept giving her Lorazepam and Oxycodon with apple sauce. In ER today noted dehydrated, UTI, and altered mental status.   VITAL SIGNS:  Blood pressure (!) 124/51, pulse (!) 57, temperature 97.6 F (36.4 C), resp. rate 18, height 4\' 11"  (1.499 m), weight 49 kg (108 lb), SpO2 99 %.  I/O:   Intake/Output Summary (Last 24 hours) at 01/08/16 1257 Last data filed at 01/08/16 0900  Gross per 24 hour  Intake          1247.78 ml  Output                0 ml  Net          1247.78 ml    PHYSICAL EXAMINATION:   GENERAL:  82 y.o.-year-old patient lying in the bed with no acute distress.  EYES: Pupils equal, round, reactive to light and accommodation. No scleral icterus. Extraocular muscles intact.  HEENT: Head atraumatic, normocephalic. Oropharynx and nasopharynx clear.  NECK:  Supple, no jugular venous distention. No thyroid enlargement, no tenderness.  LUNGS: Normal breath sounds bilaterally, no wheezing, rales, rhonchi. No use of accessory muscles of respiration.  CARDIOVASCULAR: S1, S2 normal. No murmurs, rubs, or gallops.  ABDOMEN: Soft,  nontender, nondistended. Bowel sounds present. No organomegaly or mass.  EXTREMITIES: No cyanosis, clubbing or edema b/l.    NEUROLOGIC: Moves all 4 extremities PSYCHIATRIC: The patient is alert and Awake. Pleasantly confused SKIN: No obvious rash, lesion, or ulcer.   DATA REVIEW:   CBC  Recent Labs Lab 01/06/16 0517  WBC 7.8  HGB 12.4  HCT 40.1  PLT 292    Chemistries   Recent Labs Lab 01/05/16 1427  01/08/16 0451  NA 155*  < > 144  K 3.8  < > 3.5  CL 114*  < > 112*  CO2 29  < > 24  GLUCOSE 105*  < > 99  BUN 59*  < > 18  CREATININE 1.40*  < > 0.58  CALCIUM 10.0  < >  8.7*  AST 30  --   --   ALT 21  --   --   ALKPHOS 98  --   --   BILITOT 1.0  --   --   < > = values in this interval not displayed.  Cardiac Enzymes  Recent Labs Lab 01/05/16 1427  TROPONINI <0.03    Microbiology Results  Results for orders placed or performed during the hospital encounter of 01/05/16  Urine culture     Status: None   Collection Time: 01/05/16  2:27 PM  Result Value Ref Range Status   Specimen Description URINE, RANDOM  Final   Special Requests NONE  Final   Culture NO GROWTH Performed at [facility]   Final   Report Status 01/06/2016 FINAL  Final  MRSA PCR Screening     Status: None   Collection Time: 01/05/16  7:13 PM  Result Value Ref Range Status   MRSA by PCR NEGATIVE NEGATIVE Final    Comment:        The GeneXpert MRSA Assay (FDA approved for NASAL specimens only), is one component of a comprehensive MRSA colonization surveillance program. It is not intended to diagnose MRSA infection nor to guide or monitor treatment for MRSA infections.   CULTURE, BLOOD (ROUTINE X 2) w Reflex to ID Panel     Status: None (Preliminary result)   Collection Time: 01/05/16  7:58 PM  Result Value Ref Range Status   Specimen Description BLOOD LEFT ARM  Final   Special Requests BOTTLES DRAWN AEROBIC AND ANAEROBIC 5CC  Final   Culture NO GROWTH 3 DAYS  Final   Report  Status PENDING  Incomplete  CULTURE, BLOOD (ROUTINE X 2) w Reflex to ID Panel     Status: None (Preliminary result)   Collection Time: 01/05/16  9:00 PM  Result Value Ref Range Status   Specimen Description BLOOD  RIGHT WRIST  Final   Special Requests   Final    BOTTLES DRAWN AEROBIC AND ANAEROBIC  ANA .5ML AER 1ML   Culture NO GROWTH 3 DAYS  Final   Report Status PENDING  Incomplete    RADIOLOGY:  No results found.  EKG:   Orders placed or performed during the hospital encounter of 01/05/16  . EKG 12-Lead  . EKG 12-Lead    Management plans discussed with the patient, family and they are in agreement.  CODE STATUS:     Code Status Orders        Start     Ordered   01/05/16 1828  Full code  Continuous     01/05/16 1827    Code Status History    Date Active Date Inactive Code Status Order ID Comments User Context   11/13/2015  7:35 PM 11/16/2015 12:52 PM Full Code [id number]  Wyatt Hasteavid K Hower, MD ED   10/21/2015 11:57 AM 10/23/2015  4:27 PM Full Code [id number]  Alford Highlandichard Wieting, MD ED      TOTAL TIME TAKING CARE OF THIS PATIENT: 35 minutes.    Altamese DillingVACHHANI, Giavanni Zeitlin M.D on 01/08/2016 at 12:57 PM  Between 7am to 6pm - Pager - [phone]  After 6pm go to [email or web address] - password [email or web address]  [facility]  Office  [phone]  CC: Primary care physician; Barbette ReichmannHANDE,VISHWANATH, MD   Note: This dictation was prepared with Dragon dictation along with smaller phrase technology. Any transcriptional errors that result from this process are unintentional.

## 2016-01-08 NOTE — Clinical Social Work Note (Signed)
MD to discharge [facility]. CSW spoke with their administrator, Michelle Ballard, and updated her regarding patient's mobility and what PT recommended and was able to do with patient today. Michelle Ballard stated that she could return today. PT did recommend home health and Michelle Ballard stated that their corporate office will only allow them to use [facility]. RN CM aware. CSW contacted patient's son: Michelle Ballard this afternoon and made him aware. [facility] to [facility] for patient at 3:30. Patient's son aware.  York SpanielMonica Clemma Ballard MSW,LCSW [phone]

## 2016-01-08 NOTE — Care Management (Signed)
Plan for patient to return [facility] today [facility].  PT has recommend home health PT.  [facility] contracts with [facility] for home health services.  CSW has spoken with patient's son, provided choice of home health agency and he is in agreement with [facility].  Referral made to Tim with [facility] for PT, RN, and aide.  RNCM signing off.

## 2016-01-10 LAB — CULTURE, BLOOD (ROUTINE X 2)
CULTURE: NO GROWTH
CULTURE: NO GROWTH

## 2016-01-11 DIAGNOSIS — F0391 Unspecified dementia with behavioral disturbance: Secondary | ICD-10-CM | POA: Diagnosis not present

## 2016-01-11 DIAGNOSIS — S32810D Multiple fractures of pelvis with stable disruption of pelvic ring, subsequent encounter for fracture with routine healing: Secondary | ICD-10-CM | POA: Diagnosis not present

## 2016-01-11 DIAGNOSIS — M5136 Other intervertebral disc degeneration, lumbar region: Secondary | ICD-10-CM | POA: Diagnosis not present

## 2016-01-11 DIAGNOSIS — M810 Age-related osteoporosis without current pathological fracture: Secondary | ICD-10-CM | POA: Diagnosis not present

## 2016-01-11 DIAGNOSIS — F3341 Major depressive disorder, recurrent, in partial remission: Secondary | ICD-10-CM | POA: Diagnosis not present

## 2016-01-11 DIAGNOSIS — M150 Primary generalized (osteo)arthritis: Secondary | ICD-10-CM | POA: Diagnosis not present

## 2016-01-12 DIAGNOSIS — F0391 Unspecified dementia with behavioral disturbance: Secondary | ICD-10-CM | POA: Diagnosis not present

## 2016-01-12 DIAGNOSIS — M150 Primary generalized (osteo)arthritis: Secondary | ICD-10-CM | POA: Diagnosis not present

## 2016-01-12 DIAGNOSIS — S32810D Multiple fractures of pelvis with stable disruption of pelvic ring, subsequent encounter for fracture with routine healing: Secondary | ICD-10-CM | POA: Diagnosis not present

## 2016-01-12 DIAGNOSIS — F3341 Major depressive disorder, recurrent, in partial remission: Secondary | ICD-10-CM | POA: Diagnosis not present

## 2016-01-12 DIAGNOSIS — M810 Age-related osteoporosis without current pathological fracture: Secondary | ICD-10-CM | POA: Diagnosis not present

## 2016-01-12 DIAGNOSIS — M5136 Other intervertebral disc degeneration, lumbar region: Secondary | ICD-10-CM | POA: Diagnosis not present

## 2016-01-13 ENCOUNTER — Encounter
Admission: RE | Admit: 2016-01-13 | Discharge: 2016-01-13 | Disposition: A | Discharge status: Home / Self Care | Payer: Commercial Managed Care - HMO | Source: Ambulatory Visit | Attending: Orthopedic Surgery | Admitting: Orthopedic Surgery

## 2016-01-13 DIAGNOSIS — S22080A Wedge compression fracture of T11-T12 vertebra, initial encounter for closed fracture: Secondary | ICD-10-CM | POA: Diagnosis not present

## 2016-01-13 DIAGNOSIS — D649 Anemia, unspecified: Secondary | ICD-10-CM | POA: Diagnosis not present

## 2016-01-13 DIAGNOSIS — X58XXXA Exposure to other specified factors, initial encounter: Secondary | ICD-10-CM | POA: Diagnosis not present

## 2016-01-13 DIAGNOSIS — F0390 Unspecified dementia without behavioral disturbance: Secondary | ICD-10-CM | POA: Diagnosis not present

## 2016-01-13 DIAGNOSIS — M810 Age-related osteoporosis without current pathological fracture: Secondary | ICD-10-CM | POA: Diagnosis not present

## 2016-01-13 DIAGNOSIS — F329 Major depressive disorder, single episode, unspecified: Secondary | ICD-10-CM | POA: Diagnosis not present

## 2016-01-13 DIAGNOSIS — M5136 Other intervertebral disc degeneration, lumbar region: Secondary | ICD-10-CM | POA: Diagnosis not present

## 2016-01-13 DIAGNOSIS — Z7982 Long term (current) use of aspirin: Secondary | ICD-10-CM | POA: Diagnosis not present

## 2016-01-13 DIAGNOSIS — Z87891 Personal history of nicotine dependence: Secondary | ICD-10-CM | POA: Diagnosis not present

## 2016-01-13 DIAGNOSIS — M1990 Unspecified osteoarthritis, unspecified site: Secondary | ICD-10-CM | POA: Diagnosis not present

## 2016-01-13 DIAGNOSIS — Z90710 Acquired absence of both cervix and uterus: Secondary | ICD-10-CM | POA: Diagnosis not present

## 2016-01-13 HISTORY — DX: Major depressive disorder, single episode, unspecified: F32.9

## 2016-01-13 HISTORY — DX: Depression, unspecified: F32.A

## 2016-01-13 HISTORY — DX: Repeated falls: R29.6

## 2016-01-13 HISTORY — DX: Urinary tract infection, site not specified: N39.0

## 2016-01-13 HISTORY — DX: Pneumonia, unspecified organism: J18.9

## 2016-01-13 LAB — SURGICAL PCR SCREEN
MRSA, PCR: NEGATIVE
Staphylococcus aureus: NEGATIVE

## 2016-01-13 NOTE — Patient Instructions (Signed)
  Your procedure is scheduled on:01/14/16 @ 11:00am Report to Same Day Surgery 2nd floor medical mall   Remember: Instructions that are not followed completely may result in serious medical risk, up to and including death, or upon the discretion of your surgeon and anesthesiologist your surgery may need to be rescheduled.    _x___ 1. Do not eat food or drink liquids after midnight. No gum chewing or hard candies.     __x__ 2. No Alcohol for 24 hours before or after surgery.   __x__3. No Smoking for 24 prior to surgery.   ____  4. Bring all medications with you on the day of surgery if instructed.    __x__ 5. Notify your doctor if there is any change in your medical condition     (cold, fever, infections).     Do not wear jewelry, make-up, hairpins, clips or nail polish.  Do not wear lotions, powders, or perfumes. You may wear deodorant.  Do not shave 48 hours prior to surgery. Men may shave face and neck.  Do not bring valuables to the hospital.    [facility] is not responsible for any belongings or valuables.               Contacts, dentures or bridgework may not be worn into surgery.  Leave your suitcase in the car. After surgery it may be brought to your room.  For patients admitted to the hospital, discharge time is determined by your treatment team.   Patients discharged the day of surgery will not be allowed to drive home.    Please read over the following fact sheets that you were given:   [facility] Preparing for Surgery and or MRSA Information   _x___ Take these medicines the morning of surgery with A SIP OF WATER:    1. donepezil (ARICEPT) 10 MG tablet  2.sertraline (ZOLOFT) 100 MG tablet  3.  4.  5.  6.  ____Fleets enema or Magnesium Citrate as directed.   _x___ Use CHG Soap or sage wipes as directed on instruction sheet   ____ Use inhalers on the day of surgery and bring to hospital day of surgery  ____ Stop metformin 2 days prior to surgery    ____ Take  1/2 of usual insulin dose the night before surgery and none on the morning of           surgery.   ____ Stop aspirin or coumadin, or plavix  x__ Stop Anti-inflammatories such as Advil, Aleve, Ibuprofen, Motrin, Naproxen,          Naprosyn, Goodies powders or aspirin products. Ok to take Tylenol.   ____ Stop supplements until after surgery.    ____ Bring C-Pap to the hospital.

## 2016-01-14 ENCOUNTER — Ambulatory Visit: Payer: Commercial Managed Care - HMO

## 2016-01-14 ENCOUNTER — Ambulatory Visit: Payer: Commercial Managed Care - HMO | Admitting: Anesthesiology

## 2016-01-14 ENCOUNTER — Encounter: Payer: Self-pay | Admitting: *Deleted

## 2016-01-14 ENCOUNTER — Encounter
Admission: RE | Disposition: A | Discharge status: Home / Self Care | Payer: Self-pay | Source: Ambulatory Visit | Attending: Orthopedic Surgery

## 2016-01-14 ENCOUNTER — Ambulatory Visit
Admission: RE | Admit: 2016-01-14 | Discharge: 2016-01-14 | Disposition: A | Discharge status: Home / Self Care | Payer: Commercial Managed Care - HMO | Source: Ambulatory Visit | Attending: Orthopedic Surgery | Admitting: Orthopedic Surgery

## 2016-01-14 DIAGNOSIS — M810 Age-related osteoporosis without current pathological fracture: Secondary | ICD-10-CM | POA: Diagnosis not present

## 2016-01-14 DIAGNOSIS — Z90710 Acquired absence of both cervix and uterus: Secondary | ICD-10-CM | POA: Diagnosis not present

## 2016-01-14 DIAGNOSIS — D649 Anemia, unspecified: Secondary | ICD-10-CM | POA: Insufficient documentation

## 2016-01-14 DIAGNOSIS — F329 Major depressive disorder, single episode, unspecified: Secondary | ICD-10-CM | POA: Diagnosis not present

## 2016-01-14 DIAGNOSIS — M5136 Other intervertebral disc degeneration, lumbar region: Secondary | ICD-10-CM | POA: Diagnosis not present

## 2016-01-14 DIAGNOSIS — Z87891 Personal history of nicotine dependence: Secondary | ICD-10-CM | POA: Diagnosis not present

## 2016-01-14 DIAGNOSIS — F0390 Unspecified dementia without behavioral disturbance: Secondary | ICD-10-CM | POA: Insufficient documentation

## 2016-01-14 DIAGNOSIS — M4854XA Collapsed vertebra, not elsewhere classified, thoracic region, initial encounter for fracture: Secondary | ICD-10-CM | POA: Diagnosis not present

## 2016-01-14 DIAGNOSIS — X58XXXA Exposure to other specified factors, initial encounter: Secondary | ICD-10-CM | POA: Insufficient documentation

## 2016-01-14 DIAGNOSIS — S22000A Wedge compression fracture of unspecified thoracic vertebra, initial encounter for closed fracture: Secondary | ICD-10-CM

## 2016-01-14 DIAGNOSIS — M1990 Unspecified osteoarthritis, unspecified site: Secondary | ICD-10-CM | POA: Diagnosis not present

## 2016-01-14 DIAGNOSIS — S22080A Wedge compression fracture of T11-T12 vertebra, initial encounter for closed fracture: Secondary | ICD-10-CM | POA: Diagnosis not present

## 2016-01-14 DIAGNOSIS — Z462 Encounter for fitting and adjustment of other devices related to nervous system and special senses: Secondary | ICD-10-CM | POA: Diagnosis not present

## 2016-01-14 DIAGNOSIS — Z7982 Long term (current) use of aspirin: Secondary | ICD-10-CM | POA: Insufficient documentation

## 2016-01-14 HISTORY — PX: KYPHOPLASTY: SHX5884

## 2016-01-14 SURGERY — KYPHOPLASTY
Anesthesia: General

## 2016-01-14 MED ORDER — ONDANSETRON HCL 4 MG PO TABS: 4.0000 mg | ORAL_TABLET | Freq: Four times a day (QID) | ORAL | Status: DC | PRN

## 2016-01-14 MED ORDER — IOPAMIDOL (ISOVUE-M 200) INJECTION 41%
INTRAMUSCULAR | Status: DC | PRN
  Administered 2016-01-14: 20 mL

## 2016-01-14 MED ORDER — SODIUM CHLORIDE 0.9 % IV SOLN: INTRAVENOUS | Status: DC

## 2016-01-14 MED ORDER — FAMOTIDINE 20 MG PO TABS
ORAL_TABLET | ORAL | Status: AC
  Administered 2016-01-14: 20 mg via ORAL
  Filled 2016-01-14: qty 1

## 2016-01-14 MED ORDER — METOCLOPRAMIDE HCL 10 MG PO TABS: 5.0000 mg | ORAL_TABLET | Freq: Three times a day (TID) | ORAL | Status: DC | PRN

## 2016-01-14 MED ORDER — BUPIVACAINE-EPINEPHRINE (PF) 0.5% -1:200000 IJ SOLN
INTRAMUSCULAR | Status: DC | PRN
  Administered 2016-01-14: 10 mL via PERINEURAL

## 2016-01-14 MED ORDER — LIDOCAINE HCL 1 % IJ SOLN
INTRAMUSCULAR | Status: DC | PRN
  Administered 2016-01-14: 20 mL

## 2016-01-14 MED ORDER — ACETAMINOPHEN 10 MG/ML IV SOLN
INTRAVENOUS | Status: DC | PRN
  Administered 2016-01-14: 1000 mg via INTRAVENOUS

## 2016-01-14 MED ORDER — CEFAZOLIN IN D5W 1 GM/50ML IV SOLN
1.0000 g | Freq: Once | INTRAVENOUS | Status: AC
  Administered 2016-01-14: 1 g via INTRAVENOUS

## 2016-01-14 MED ORDER — LIDOCAINE HCL (PF) 1 % IJ SOLN
INTRAMUSCULAR | Status: AC
  Filled 2016-01-14: qty 60

## 2016-01-14 MED ORDER — ONDANSETRON HCL 4 MG/2ML IJ SOLN: 4.0000 mg | Freq: Four times a day (QID) | INTRAMUSCULAR | Status: DC | PRN

## 2016-01-14 MED ORDER — ACETAMINOPHEN 10 MG/ML IV SOLN
INTRAVENOUS | Status: AC
  Filled 2016-01-14: qty 100

## 2016-01-14 MED ORDER — CEFAZOLIN IN D5W 1 GM/50ML IV SOLN
INTRAVENOUS | Status: AC
  Filled 2016-01-14: qty 50

## 2016-01-14 MED ORDER — HYDROMORPHONE HCL 1 MG/ML IJ SOLN
INTRAMUSCULAR | Status: DC | PRN
  Administered 2016-01-14: 0.5 mg via INTRAVENOUS

## 2016-01-14 MED ORDER — PROPOFOL 500 MG/50ML IV EMUL
INTRAVENOUS | Status: DC | PRN
  Administered 2016-01-14: 100 ug/kg/min via INTRAVENOUS

## 2016-01-14 MED ORDER — FAMOTIDINE 20 MG PO TABS
20.0000 mg | ORAL_TABLET | Freq: Once | ORAL | Status: AC
  Administered 2016-01-14: 20 mg via ORAL

## 2016-01-14 MED ORDER — LACTATED RINGERS IV SOLN
INTRAVENOUS | Status: DC
Start: 2016-01-14 — End: 2016-01-14
  Administered 2016-01-14: 12:00:00 via INTRAVENOUS

## 2016-01-14 MED ORDER — METOCLOPRAMIDE HCL 5 MG/ML IJ SOLN: 5.0000 mg | Freq: Three times a day (TID) | INTRAMUSCULAR | Status: DC | PRN

## 2016-01-14 MED ORDER — HYDROCODONE-ACETAMINOPHEN 5-325 MG PO TABS: 1.0000 | ORAL_TABLET | ORAL | Status: DC | PRN

## 2016-01-14 MED ORDER — FENTANYL CITRATE (PF) 100 MCG/2ML IJ SOLN: 25.0000 ug | INTRAMUSCULAR | Status: DC | PRN

## 2016-01-14 MED ORDER — KETAMINE HCL 50 MG/ML IJ SOLN
INTRAMUSCULAR | Status: DC | PRN
  Administered 2016-01-14: 25 mg via INTRAMUSCULAR

## 2016-01-14 MED ORDER — EPINEPHRINE HCL 0.1 MG/ML IJ SOSY
PREFILLED_SYRINGE | INTRAMUSCULAR | Status: DC | PRN
  Administered 2016-01-14: 20 ug via INTRAVENOUS

## 2016-01-14 MED ORDER — PROPOFOL 10 MG/ML IV BOLUS
INTRAVENOUS | Status: DC | PRN
  Administered 2016-01-14: 30 mg via INTRAVENOUS

## 2016-01-14 MED ORDER — IOPAMIDOL (ISOVUE-M 200) INJECTION 41%
INTRAMUSCULAR | Status: AC
  Filled 2016-01-14: qty 20

## 2016-01-14 MED ORDER — PHENYLEPHRINE HCL 10 MG/ML IJ SOLN
INTRAMUSCULAR | Status: DC | PRN
  Administered 2016-01-14: 100 ug via INTRAVENOUS

## 2016-01-14 MED ORDER — ONDANSETRON HCL 4 MG/2ML IJ SOLN: 4.0000 mg | Freq: Once | INTRAMUSCULAR | Status: DC | PRN

## 2016-01-14 MED ORDER — HYDROCODONE-ACETAMINOPHEN 5-325 MG PO TABS: 1.0000 | ORAL_TABLET | Freq: Four times a day (QID) | ORAL | 0 refills | Status: DC | PRN

## 2016-01-14 SURGICAL SUPPLY — 17 items
CEMENT KYPHON CX01A KIT/MIXER (Cement) ×2 IMPLANT
DEVICE BIOPSY BONE KYPHX (INSTRUMENTS) ×2 IMPLANT
DRAPE C-ARM XRAY 36X54 (DRAPES) ×2 IMPLANT
DURAPREP 26ML APPLICATOR (WOUND CARE) ×2 IMPLANT
GLOVE BIO SURGEON STRL SZ7 (GLOVE) ×2 IMPLANT
GLOVE INDICATOR 7.5 STRL GRN (GLOVE) ×2 IMPLANT
GLOVE SURG SYN 9.0  PF PI (GLOVE) ×2
GLOVE SURG SYN 9.0 PF PI (GLOVE) ×2 IMPLANT
GOWN SRG 2XL LVL 4 RGLN SLV (GOWNS) ×1 IMPLANT
GOWN STRL NON-REIN 2XL LVL4 (GOWNS) ×1
GOWN STRL REUS W/ TWL LRG LVL3 (GOWN DISPOSABLE) ×1 IMPLANT
GOWN STRL REUS W/TWL LRG LVL3 (GOWN DISPOSABLE) ×1
LIQUID BAND (GAUZE/BANDAGES/DRESSINGS) ×2 IMPLANT
PACK KYPHOPLASTY (MISCELLANEOUS) ×2 IMPLANT
STRAP SAFETY BODY (MISCELLANEOUS) ×2 IMPLANT
TRAY KYPHOPAK 15/3 EXPRESS 1ST (MISCELLANEOUS) ×2 IMPLANT
TRAY KYPHOPAK 20/3 EXPRESS 1ST (MISCELLANEOUS) IMPLANT

## 2016-01-14 NOTE — Discharge Instructions (Addendum)
Normal activities except avoid lifting. Remove band aid on Saturday, ok to shower then.  AMBULATORY SURGERY  DISCHARGE INSTRUCTIONS   1) The drugs that you were given will stay in your system until tomorrow so for the next 24 hours you should not:  A) Drive an automobile B) Make any legal decisions C) Drink any alcoholic beverage   2) You may resume regular meals tomorrow.  Today it is better to start with liquids and gradually work up to solid foods.  You may eat anything you prefer, but it is better to start with liquids, then soup and crackers, and gradually work up to solid foods.   3) Please notify your doctor immediately if you have any unusual bleeding, trouble breathing, redness and pain at the surgery site, drainage, fever, or pain not relieved by medication.    4) Additional Instructions:        Please contact your physician with any problems or Same Day Surgery at [phone], Monday through Friday 6 am to 4 pm, or [place] at [facility] Main number at [phone].

## 2016-01-14 NOTE — Anesthesia Preprocedure Evaluation (Signed)
Anesthesia Evaluation  Patient identified by MRN, date of birth, ID band Patient awake and Patient confused    Reviewed: Allergy & Precautions, NPO status , Patient's Chart, lab work & pertinent test results  History of Anesthesia Complications Negative for: history of anesthetic complications  Airway Mallampati: II       Dental  (+) Upper Dentures, Lower Dentures   Pulmonary neg pulmonary ROS, former smoker,           Cardiovascular negative cardio ROS       Neuro/Psych Depression Dementia    GI/Hepatic negative GI ROS, Neg liver ROS,   Endo/Other  negative endocrine ROS  Renal/GU negative Renal ROS     Musculoskeletal   Abdominal   Peds  Hematology  (+) anemia ,   Anesthesia Other Findings   Reproductive/Obstetrics                             Anesthesia Physical Anesthesia Plan  ASA: III  Anesthesia Plan: General   Post-op Pain Management:    Induction: Intravenous  Airway Management Planned:   Additional Equipment:   Intra-op Plan:   Post-operative Plan:   Informed Consent: I have reviewed the patients History and Physical, chart, labs and discussed the procedure including the risks, benefits and alternatives for the proposed anesthesia with the patient or authorized representative who has indicated his/her understanding and acceptance.     Plan Discussed with:   Anesthesia Plan Comments:         Anesthesia Quick Evaluation

## 2016-01-14 NOTE — Anesthesia Postprocedure Evaluation (Signed)
Anesthesia Post Note  Patient: Michelle Ballard  Procedure(s) Performed: Procedure(s) (LRB): KYPHOPLASTY  T-12 (N/A)  Patient location during evaluation: PACU Anesthesia Type: General Level of consciousness: awake and alert Pain management: pain level controlled Vital Signs Assessment: post-procedure vital signs reviewed and stable Respiratory status: spontaneous breathing and respiratory function stable Cardiovascular status: stable Anesthetic complications: no    Last Vitals:  Vitals:   01/14/16 1445 01/14/16 1456  BP: (!) 119/48 (!) 131/47  Pulse: 62 (!) 57  Resp: (!) 22 20  Temp: 36.8 C 36.7 C    Last Pain:  Vitals:   01/14/16 1456  TempSrc: Oral  PainSc:                  [name] K

## 2016-01-14 NOTE — H&P (Signed)
Reviewed paper H+P, will be scanned into chart. No changes noted.  

## 2016-01-14 NOTE — Transfer of Care (Signed)
Immediate Anesthesia Transfer of Care Note  Patient: Michelle Ballard  Procedure(s) Performed: Procedure(s): KYPHOPLASTY  T-12 (N/A)  Patient Location: PACU  Anesthesia Type:General  Level of Consciousness: awake and patient cooperative  Airway & Oxygen Therapy: Patient Spontanous Breathing and Patient connected to nasal cannula oxygen  Post-op Assessment: Report given to RN and Post -op Vital signs reviewed and stable  Post vital signs: Reviewed and stable  Last Vitals:  Vitals:   01/14/16 1212  BP: (!) 129/92  Pulse: 83  Resp: 20  Temp: 36.8 C    Last Pain:  Vitals:   01/14/16 1212  TempSrc: Oral  PainSc: 8       Patients Stated Pain Goal: 2 (01/14/16 1212)  Complications: No apparent anesthesia complications

## 2016-01-14 NOTE — Progress Notes (Signed)
Left hand red and bruised before starting IV, IV infusing slowly and pt c/o pain in IV site. Dr. Rosita KeaMenz in with pt and aware of IV status and stated that he would communicate the IV status to the CRNA. I communicated the IV status to the OR RN and she stated that he would communicate the IV to the CRNA.

## 2016-01-14 NOTE — Op Note (Signed)
01/14/2016  2:02 PM  PATIENT:  Benjamine SpragueElna M Socorro  [age] y.o. female  PRE-OPERATIVE DIAGNOSIS:  T12 COMPRESSION FRACTURE  POST-OPERATIVE DIAGNOSIS:  T12 compression fracture  PROCEDURE:  Procedure(s): KYPHOPLASTY  T-12 (N/A)  SURGEON: Leitha SchullerMichael J Joanthony Hamza, MD  ASSISTANTS: None  ANESTHESIA:   local and MAC  EBL:  Total I/O In: 300 [I.V.:300] Out: 5 [Blood:5]  BLOOD ADMINISTERED:none  DRAINS: none   LOCAL MEDICATIONS USED:  MARCAINE    and XYLOCAINE   SPECIMEN:  No Specimen  DISPOSITION OF SPECIMEN:  N/A  COUNTS:  YES  TOURNIQUET:  * No tourniquets in log *  IMPLANTS: Bone cement  DICTATION: .Dragon Dictation patient brought the operating room and after adequate sedation was given the patient was placed prone. C-arm was brought in and excellent visualization of T12 was obtained in both AP and lateral projections. After patient identification and timeout procedures were completed, local anesthetic was infiltrated on the left side to get local anesthetic to the skin a 1% Xylocaine  after prepping with chlorhexidine. The back was then fully prepped and draped in a repeat timeout procedure carried out. Spinal needle was used on the left side to get local down to the pedicle and 20 cc of local injected from the bone to the subcutaneous layer. After allowing this to set him a small incision was made and a trocar advanced in an extra pedicular fashion and biopsy of the T12 vertebral body was unsuccessful with no specimen obtained. Drilling was carried out and a balloon inserted and inflated to about 2-1/2 cc. Cement was mixed and when it was the appropriate consistency it was injected into the void without extravasation getting across the midline and there is adequate fill. After allowing the cement set the trochars removed and permanent C-arm views obtained. The wound was closed with Dermabond  PLAN OF CARE: Discharge to home after PACU  PATIENT DISPOSITION:  PACU - hemodynamically stable.

## 2016-01-15 DIAGNOSIS — L89301 Pressure ulcer of unspecified buttock, stage 1: Secondary | ICD-10-CM | POA: Diagnosis not present

## 2016-01-15 DIAGNOSIS — F0150 Vascular dementia without behavioral disturbance: Secondary | ICD-10-CM | POA: Diagnosis not present

## 2016-01-18 DIAGNOSIS — M810 Age-related osteoporosis without current pathological fracture: Secondary | ICD-10-CM | POA: Diagnosis not present

## 2016-01-18 DIAGNOSIS — F3341 Major depressive disorder, recurrent, in partial remission: Secondary | ICD-10-CM | POA: Diagnosis not present

## 2016-01-18 DIAGNOSIS — M5136 Other intervertebral disc degeneration, lumbar region: Secondary | ICD-10-CM | POA: Diagnosis not present

## 2016-01-18 DIAGNOSIS — S32810D Multiple fractures of pelvis with stable disruption of pelvic ring, subsequent encounter for fracture with routine healing: Secondary | ICD-10-CM | POA: Diagnosis not present

## 2016-01-18 DIAGNOSIS — F0391 Unspecified dementia with behavioral disturbance: Secondary | ICD-10-CM | POA: Diagnosis not present

## 2016-01-18 DIAGNOSIS — M150 Primary generalized (osteo)arthritis: Secondary | ICD-10-CM | POA: Diagnosis not present

## 2016-01-20 DIAGNOSIS — Z9889 Other specified postprocedural states: Secondary | ICD-10-CM | POA: Diagnosis not present

## 2016-04-11 ENCOUNTER — Emergency Department
Admission: EM | Admit: 2016-04-11 | Discharge: 2016-04-11 | Disposition: A | Discharge status: Home / Self Care | Payer: Medicare HMO | Attending: Emergency Medicine | Admitting: Emergency Medicine

## 2016-04-11 DIAGNOSIS — Z79899 Other long term (current) drug therapy: Secondary | ICD-10-CM | POA: Diagnosis not present

## 2016-04-11 DIAGNOSIS — I951 Orthostatic hypotension: Secondary | ICD-10-CM

## 2016-04-11 DIAGNOSIS — Z7982 Long term (current) use of aspirin: Secondary | ICD-10-CM | POA: Diagnosis not present

## 2016-04-11 DIAGNOSIS — R55 Syncope and collapse: Secondary | ICD-10-CM | POA: Diagnosis not present

## 2016-04-11 DIAGNOSIS — Z87891 Personal history of nicotine dependence: Secondary | ICD-10-CM | POA: Diagnosis not present

## 2016-04-11 DIAGNOSIS — E860 Dehydration: Secondary | ICD-10-CM

## 2016-04-11 LAB — BASIC METABOLIC PANEL
Anion gap: 9 (ref 5–15)
BUN: 32 mg/dL — ABNORMAL HIGH (ref 6–20)
CHLORIDE: 108 mmol/L (ref 101–111)
CO2: 23 mmol/L (ref 22–32)
CREATININE: 1 mg/dL (ref 0.44–1.00)
Calcium: 9.7 mg/dL (ref 8.9–10.3)
GFR, EST AFRICAN AMERICAN: 59 mL/min — AB (ref >60)
GFR, EST NON AFRICAN AMERICAN: 51 mL/min — AB (ref >60)
Glucose, Bld: 135 mg/dL — ABNORMAL HIGH (ref 65–99)
POTASSIUM: 3.8 mmol/L (ref 3.5–5.1)
SODIUM: 140 mmol/L (ref 135–145)

## 2016-04-11 LAB — CBC
HCT: 34.1 % — ABNORMAL LOW (ref 35.0–47.0)
Hemoglobin: 10.8 g/dL — ABNORMAL LOW (ref 12.0–16.0)
MCH: 20.8 pg — ABNORMAL LOW (ref 26.0–34.0)
MCHC: 31.6 g/dL — ABNORMAL LOW (ref 32.0–36.0)
MCV: 65.7 fL — AB (ref 80.0–100.0)
PLATELETS: 415 10*3/uL (ref 150–440)
RBC: 5.2 MIL/uL (ref 3.80–5.20)
RDW: 18 % — ABNORMAL HIGH (ref 11.5–14.5)
WBC: 8.3 10*3/uL (ref 3.6–11.0)

## 2016-04-11 LAB — URINALYSIS, COMPLETE (UACMP) WITH MICROSCOPIC
BILIRUBIN URINE: NEGATIVE
Bacteria, UA: NONE SEEN
GLUCOSE, UA: NEGATIVE mg/dL
HGB URINE DIPSTICK: NEGATIVE
Ketones, ur: 5 mg/dL — AB
LEUKOCYTES UA: NEGATIVE
Nitrite: NEGATIVE
PH: 5 (ref 5.0–8.0)
Protein, ur: 30 mg/dL — AB
RBC / HPF: NONE SEEN RBC/hpf (ref 0–5)
SPECIFIC GRAVITY, URINE: 1.025 (ref 1.005–1.030)
SQUAMOUS EPITHELIAL / LPF: NONE SEEN
WBC, UA: NONE SEEN WBC/hpf (ref 0–5)

## 2016-04-11 MED ORDER — SODIUM CHLORIDE 0.9 % IV BOLUS (SEPSIS)
1000.0000 mL | Freq: Once | INTRAVENOUS | Status: AC
  Administered 2016-04-11: 1000 mL via INTRAVENOUS

## 2016-04-11 NOTE — ED Provider Notes (Signed)
[facility] Emergency Department Provider Note  ____________________________________________  Time seen: Approximately 2:29 PM  I have reviewed the triage vital signs and the nursing notes.   HISTORY  Chief Complaint Loss of Consciousness Level 5 caveat:  Portions of the history and physical were unable to be obtained due to the patient's altered mental status from advanced chronic dementia    HPI Michelle Ballard is a 83 y.o. female brought to the ED due to apparent syncope episode. Patient was sitting in a chair when she became unresponsive and seemed to slump down for a minute or 2. She was evaluated at the facility there found to have a blood pressure of about 60/40. Within 15 or 30 minutes the patient seemed to recover and blood pressure improved back to normal. Other vital signs were back to normal. Patient denies any pain complaints or other acute issues. Family is not aware of any changes in status such as fever vomiting diarrhea pain or decreased oral intake.     Past Medical History:  Diagnosis Date  . Dementia   . Depression   . Multiple falls   . Multiple pelvic fractures (HCC)   . Pneumonia   . UTI (urinary tract infection)      Patient Active Problem List   Diagnosis Date Noted  . Pressure injury of skin 01/06/2016  . UTI (lower urinary tract infection) 01/05/2016  . Dehydration 01/05/2016  . Altered mental status 01/05/2016  . Dizziness 12/04/2015  . Vitamin B12 deficiency 12/04/2015  . Hypokalemia 11/16/2015  . Anemia 11/16/2015  . Dementia with behavioral disturbance 11/16/2015  . Pelvic fracture, closed, initial encounter 11/13/2015     Past Surgical History:  Procedure Laterality Date  . ABDOMINAL HYSTERECTOMY    . FRACTURE SURGERY    . KYPHOPLASTY N/A 01/14/2016   Procedure: KYPHOPLASTY  T-12;  Surgeon: Kennedy BuckerMichael Menz, MD;  Location: [facility] ORS;  Service: Orthopedics;  Laterality: N/A;     Prior to Admission medications    Medication Sig Start Date End Date Taking? Authorizing Provider  acetaminophen (TYLENOL) 650 MG CR tablet Take 650 mg by mouth every 8 (eight) hours.   Yes Historical Provider, MD  aspirin 325 MG tablet Take 325 mg by mouth 2 (two) times daily.    Yes Historical Provider, MD  Cholecalciferol (VITAMIN D3) 2000 units TABS Take 2 capsules by mouth daily.   Yes Historical Provider, MD  CLOTRIMAZOLE VA Place 1 application vaginally daily. 04/11/16 04/18/16 Yes Historical Provider, MD  docusate sodium (COLACE) 100 MG capsule Take 100 mg by mouth 2 (two) times daily.   Yes Historical Provider, MD  donepezil (ARICEPT) 10 MG tablet Take 10 mg by mouth 2 (two) times daily.   Yes Historical Provider, MD  feeding supplement, ENSURE ENLIVE, (ENSURE ENLIVE) LIQD Take 237 mLs by mouth 2 (two) times daily between meals. 01/08/16  Yes Altamese DillingVaibhavkumar Vachhani, MD  megestrol (MEGACE) 40 MG/ML suspension Take 10 mg by mouth daily.    Yes Historical Provider, MD  polyethylene glycol (MIRALAX / GLYCOLAX) packet Take 17 g by mouth daily as needed for mild constipation. 11/16/15  Yes Katharina Caperima Vaickute, MD  QUEtiapine (SEROQUEL) 50 MG tablet Take 50 mg by mouth 2 (two) times daily.    Yes Historical Provider, MD  senna (SENOKOT) 8.6 MG TABS tablet Take 1 tablet (8.6 mg total) by mouth daily. 12/26/15  Yes Nita Sicklearolina Veronese, MD  sertraline (ZOLOFT) 100 MG tablet Take 100 mg by mouth daily.   Yes Historical  Provider, MD  Valproate Sodium (DEPAKENE) 250 MG/5ML SOLN solution Take 5 mLs by mouth 2 (two) times daily. 02/22/16  Yes Historical Provider, MD  cefUROXime (CEFTIN) 250 MG tablet Take 1 tablet (250 mg total) by mouth 2 (two) times daily with a meal. X 4 days Patient not taking: Reported on 01/14/2016 01/08/16   Altamese Dilling, MD  HYDROcodone-acetaminophen (NORCO) 5-325 MG tablet Take 1 tablet by mouth every 6 (six) hours as needed for moderate pain. Patient not taking: Reported on 04/11/2016 01/14/16   Kennedy Bucker, MD   HYDROcodone-acetaminophen (NORCO/VICODIN) 5-325 MG tablet Take 0.5 tablets by mouth every 6 (six) hours as needed for moderate pain or severe pain. Patient not taking: Reported on 04/11/2016 01/08/16   Altamese Dilling, MD  lidocaine (LIDODERM) 5 % Place 1 patch onto the skin every 12 (twelve) hours. Remove & Discard patch within 12 hours or as directed by MD Patient not taking: Reported on 04/11/2016 12/26/15 12/25/16  Nita Sickle, MD     Allergies Patient has no known allergies.   Family History  Problem Relation Age of Onset  . Lung cancer Mother   . Lung cancer Father   . Cirrhosis Brother     Social History Social History  Substance Use Topics  . Smoking status: Former Smoker    Packs/day: 0.25    Years: 40.00    Quit date: 01/13/2015  . Smokeless tobacco: Never Used  . Alcohol use No    Review of Systems Unable to reliably obtained due to altered mental status. ____________________________________________   PHYSICAL EXAM:  VITAL SIGNS: ED Triage Vitals  Enc Vitals Group     BP 04/11/16 1213 108/65     Pulse Rate 04/11/16 1213 80     Resp 04/11/16 1213 16     Temp 04/11/16 1213 97.5 F (36.4 C)     Temp Source 04/11/16 1213 Oral     SpO2 --      Weight 04/11/16 1214 118 lb 14.4 oz (53.9 kg)     Height --      Head Circumference --      Peak Flow --      Pain Score 04/11/16 1215 0     Pain Loc --      Pain Edu? --      Excl. in GC? --     Vital signs reviewed, nursing assessments reviewed.   Constitutional:   Alert and orientedTo person and place. Well appearing and in no distress. Eyes:   No scleral icterus. No conjunctival pallor. PERRL. EOMI.  No nystagmus. ENT   Head:   Normocephalic and atraumatic.   Nose:   No congestion/rhinnorhea. No septal hematoma   Mouth/Throat:   Dry mucous membranes, no pharyngeal erythema. No peritonsillar mass.    Neck:   No stridor. No SubQ emphysema. No  meningismus. Hematological/Lymphatic/Immunilogical:   No cervical lymphadenopathy. Cardiovascular:   RRR. Symmetric bilateral radial and DP pulses.  No murmurs.  Respiratory:   Normal respiratory effort without tachypnea nor retractions. Breath sounds are clear and equal bilaterally. No wheezes/rales/rhonchi. Gastrointestinal:   Soft and nontender. Non distended. There is no CVA tenderness.  No rebound, rigidity, or guarding. Genitourinary:   deferred Musculoskeletal:   Nontender with normal range of motion in all extremities. No joint effusions.  No lower extremity tenderness.  No edema. Neurologic:   Normal speech and language.  CN 2-10 normal. Motor grossly intact. No gross focal neurologic deficits are appreciated.  Skin:    Skin  is warm, dry and intact. No rash noted.  No petechiae, purpura, or bullae. Decreased skin turgor  ____________________________________________    LABS (pertinent positives/negatives) (all labs ordered are listed, but only abnormal results are displayed) Labs Reviewed  BASIC METABOLIC PANEL - Abnormal; Notable for the following:       Result Value   Glucose, Bld 135 (*)    BUN 32 (*)    GFR calc non Af Amer 51 (*)    GFR calc Af Amer 59 (*)    All other components within normal limits  CBC - Abnormal; Notable for the following:    Hemoglobin 10.8 (*)    HCT 34.1 (*)    MCV 65.7 (*)    MCH 20.8 (*)    MCHC 31.6 (*)    RDW 18.0 (*)    All other components within normal limits  URINALYSIS, COMPLETE (UACMP) WITH MICROSCOPIC - Abnormal; Notable for the following:    Color, Urine AMBER (*)    APPearance CLEAR (*)    Ketones, ur 5 (*)    Protein, ur 30 (*)    All other components within normal limits  CBG MONITORING, ED   ____________________________________________   EKG  Interpreted by me Sinus rhythm rate of 78, normal axis and intervals. Right bundle-branch block. Normal ST segments and T waves. No acute ischemic  changes.  ____________________________________________    RADIOLOGY    ____________________________________________   PROCEDURES Procedures  ____________________________________________   INITIAL IMPRESSION / ASSESSMENT AND PLAN / ED COURSE  Pertinent labs & imaging results that were available during my care of the patient were reviewed by me and considered in my medical decision making (see chart for details).  Patient not in distress, but not able to provide a clear history. Check labs and urinalysis and orthostatic vital signs.    ----------------------------------------- 2:32 PM on 04/11/2016 -----------------------------------------  Labs unremarkable but orthostatics positive with 30 point drop in systolic blood pressure and significant increase in heart rate. We'll give IV fluids. Awaiting urinalysis.    ----------------------------------------- 3:27 PM on 04/11/2016 -----------------------------------------  Patient finishing up 1 L fluid bolus. Plan to recheck orthostatic vital signs afterward for improvement. Would then ambulate patient to ensure she remains asymptomatic. If these tests provide reassuring results, patient should be suitable for discharge home due to resolution of dehydration. If still orthostatic, may require hospitalization for further management. Care of patient be signed out to Dr. Alphonzo Lemmings to reassess.   Clinical Course    ____________________________________________   FINAL CLINICAL IMPRESSION(S) / ED DIAGNOSES  Final diagnoses:  Orthostatic hypotension  Dehydration      New Prescriptions   No medications on file     Portions of this note were generated with dragon dictation software. Dictation errors may occur despite best attempts at proofreading.    Sharman Cheek, MD 04/11/16 [phone]

## 2016-04-11 NOTE — ED Triage Notes (Signed)
Pt BIB [facility] from [facility] unit. Staff at [facility] told EMS pt had a 2 minute episode of "unresponsiveness." PT was sitting in chair. Staff took BP and it was 60/40 with 99.5 oral temp. EMS took BP and it was 110/60, HR 92, 95% RA. CBG 165. NRS on EMS monitor. Pt likes to question what ED staff is doing to her, such as EKG and IV. Pt is cold upon arrival, that is her only complaint. Denies pain.

## 2016-04-11 NOTE — ED Notes (Signed)
Pt denies CP, SOB, headache, dizziness, blurred vision, weakness.

## 2016-04-11 NOTE — ED Notes (Signed)
EMS mentioned to this RN that pt stated she needed to have a BM and thought maybe pt vagualed down while [facility].

## 2016-04-11 NOTE — ED Provider Notes (Signed)
-----------------------------------------   5:21 PM on 04/11/2016 -----------------------------------------  Signed out to me by dr. Suzanne Boronstaffored at 3;45. Per his plan, if she is not orthostatic after ivf she is to be discharged she has no complaints and is eager to go home will d.c. Return precautions and f/u given and understood.    Michelle PlantJames A Daylah Sayavong, MD 04/11/16 [phone]

## 2016-04-25 DIAGNOSIS — R0902 Hypoxemia: Secondary | ICD-10-CM | POA: Diagnosis not present

## 2016-04-25 DIAGNOSIS — M1990 Unspecified osteoarthritis, unspecified site: Secondary | ICD-10-CM | POA: Diagnosis not present

## 2016-04-25 DIAGNOSIS — F0391 Unspecified dementia with behavioral disturbance: Secondary | ICD-10-CM | POA: Diagnosis not present

## 2016-04-25 DIAGNOSIS — F339 Major depressive disorder, recurrent, unspecified: Secondary | ICD-10-CM | POA: Diagnosis not present

## 2016-04-25 DIAGNOSIS — R269 Unspecified abnormalities of gait and mobility: Secondary | ICD-10-CM | POA: Diagnosis not present

## 2016-04-25 DIAGNOSIS — M810 Age-related osteoporosis without current pathological fracture: Secondary | ICD-10-CM | POA: Diagnosis not present

## 2016-04-25 DIAGNOSIS — R634 Abnormal weight loss: Secondary | ICD-10-CM | POA: Diagnosis not present

## 2016-04-25 DIAGNOSIS — G8921 Chronic pain due to trauma: Secondary | ICD-10-CM | POA: Diagnosis not present

## 2016-04-29 DIAGNOSIS — J069 Acute upper respiratory infection, unspecified: Secondary | ICD-10-CM | POA: Diagnosis not present

## 2016-04-29 DIAGNOSIS — J208 Acute bronchitis due to other specified organisms: Secondary | ICD-10-CM | POA: Diagnosis not present

## 2016-04-29 DIAGNOSIS — M5136 Other intervertebral disc degeneration, lumbar region: Secondary | ICD-10-CM | POA: Diagnosis not present

## 2016-04-29 DIAGNOSIS — F0390 Unspecified dementia without behavioral disturbance: Secondary | ICD-10-CM | POA: Diagnosis not present

## 2016-04-29 DIAGNOSIS — F339 Major depressive disorder, recurrent, unspecified: Secondary | ICD-10-CM | POA: Diagnosis not present

## 2016-04-29 DIAGNOSIS — R05 Cough: Secondary | ICD-10-CM | POA: Diagnosis not present

## 2016-04-29 DIAGNOSIS — G894 Chronic pain syndrome: Secondary | ICD-10-CM | POA: Diagnosis not present

## 2016-04-29 DIAGNOSIS — R5383 Other fatigue: Secondary | ICD-10-CM | POA: Diagnosis not present

## 2016-05-09 DIAGNOSIS — Z79899 Other long term (current) drug therapy: Secondary | ICD-10-CM | POA: Diagnosis not present

## 2016-05-16 DIAGNOSIS — E46 Unspecified protein-calorie malnutrition: Secondary | ICD-10-CM | POA: Diagnosis not present

## 2016-05-16 DIAGNOSIS — F0391 Unspecified dementia with behavioral disturbance: Secondary | ICD-10-CM | POA: Diagnosis not present

## 2016-05-16 DIAGNOSIS — D649 Anemia, unspecified: Secondary | ICD-10-CM | POA: Diagnosis not present

## 2016-05-16 DIAGNOSIS — J069 Acute upper respiratory infection, unspecified: Secondary | ICD-10-CM | POA: Diagnosis not present

## 2016-05-16 DIAGNOSIS — F329 Major depressive disorder, single episode, unspecified: Secondary | ICD-10-CM | POA: Diagnosis not present

## 2016-05-16 DIAGNOSIS — D563 Thalassemia minor: Secondary | ICD-10-CM | POA: Diagnosis not present

## 2016-05-16 DIAGNOSIS — N189 Chronic kidney disease, unspecified: Secondary | ICD-10-CM | POA: Diagnosis not present

## 2016-05-16 DIAGNOSIS — R946 Abnormal results of thyroid function studies: Secondary | ICD-10-CM | POA: Diagnosis not present

## 2016-05-20 DIAGNOSIS — Z79899 Other long term (current) drug therapy: Secondary | ICD-10-CM | POA: Diagnosis not present

## 2016-05-20 DIAGNOSIS — D649 Anemia, unspecified: Secondary | ICD-10-CM | POA: Diagnosis not present

## 2016-05-20 DIAGNOSIS — R946 Abnormal results of thyroid function studies: Secondary | ICD-10-CM | POA: Diagnosis not present

## 2016-05-30 DIAGNOSIS — E46 Unspecified protein-calorie malnutrition: Secondary | ICD-10-CM | POA: Diagnosis not present

## 2016-05-30 DIAGNOSIS — D563 Thalassemia minor: Secondary | ICD-10-CM | POA: Diagnosis not present

## 2016-05-30 DIAGNOSIS — D649 Anemia, unspecified: Secondary | ICD-10-CM | POA: Diagnosis not present

## 2016-05-30 DIAGNOSIS — Z8781 Personal history of (healed) traumatic fracture: Secondary | ICD-10-CM | POA: Diagnosis not present

## 2016-05-30 DIAGNOSIS — R946 Abnormal results of thyroid function studies: Secondary | ICD-10-CM | POA: Diagnosis not present

## 2016-05-30 DIAGNOSIS — M545 Low back pain: Secondary | ICD-10-CM | POA: Diagnosis not present

## 2016-05-30 DIAGNOSIS — R269 Unspecified abnormalities of gait and mobility: Secondary | ICD-10-CM | POA: Diagnosis not present

## 2016-05-30 DIAGNOSIS — R233 Spontaneous ecchymoses: Secondary | ICD-10-CM | POA: Diagnosis not present

## 2016-06-18 ENCOUNTER — Emergency Department
Admission: EM | Admit: 2016-06-18 | Discharge: 2016-06-18 | Disposition: A | Discharge status: Home / Self Care | Payer: Medicare HMO | Attending: Student in an Organized Health Care Education/Training Program | Admitting: Student in an Organized Health Care Education/Training Program

## 2016-06-18 DIAGNOSIS — Z7982 Long term (current) use of aspirin: Secondary | ICD-10-CM | POA: Insufficient documentation

## 2016-06-18 DIAGNOSIS — Y929 Unspecified place or not applicable: Secondary | ICD-10-CM | POA: Insufficient documentation

## 2016-06-18 DIAGNOSIS — Z79899 Other long term (current) drug therapy: Secondary | ICD-10-CM | POA: Insufficient documentation

## 2016-06-18 DIAGNOSIS — Y939 Activity, unspecified: Secondary | ICD-10-CM | POA: Diagnosis not present

## 2016-06-18 DIAGNOSIS — Z043 Encounter for examination and observation following other accident: Secondary | ICD-10-CM | POA: Diagnosis not present

## 2016-06-18 DIAGNOSIS — F0390 Unspecified dementia without behavioral disturbance: Secondary | ICD-10-CM | POA: Insufficient documentation

## 2016-06-18 DIAGNOSIS — Z048 Encounter for examination and observation for other specified reasons: Secondary | ICD-10-CM | POA: Insufficient documentation

## 2016-06-18 DIAGNOSIS — Y999 Unspecified external cause status: Secondary | ICD-10-CM | POA: Insufficient documentation

## 2016-06-18 DIAGNOSIS — Z87891 Personal history of nicotine dependence: Secondary | ICD-10-CM | POA: Insufficient documentation

## 2016-06-18 DIAGNOSIS — M549 Dorsalgia, unspecified: Secondary | ICD-10-CM | POA: Diagnosis not present

## 2016-06-18 DIAGNOSIS — W19XXXA Unspecified fall, initial encounter: Secondary | ICD-10-CM | POA: Diagnosis not present

## 2016-06-18 LAB — URINALYSIS, COMPLETE (UACMP) WITH MICROSCOPIC
Bacteria, UA: NONE SEEN
Bilirubin Urine: NEGATIVE
GLUCOSE, UA: NEGATIVE mg/dL
HGB URINE DIPSTICK: NEGATIVE
Ketones, ur: NEGATIVE mg/dL
Leukocytes, UA: NEGATIVE
Nitrite: NEGATIVE
PH: 5 (ref 5.0–8.0)
Protein, ur: NEGATIVE mg/dL
SPECIFIC GRAVITY, URINE: 1.028 (ref 1.005–1.030)

## 2016-06-18 NOTE — ED Triage Notes (Signed)
Pt here via [facility]s from [facility], pt was found by a technician at the facility, unwitnessed fall, pt denies any injury, or pain at this time

## 2016-06-18 NOTE — ED Provider Notes (Signed)
[facility] Emergency Department Provider Note    First MD Initiated Contact with Patient 06/18/16 1456     (approximate)  I have reviewed the triage vital signs and the nursing notes.   HISTORY  Chief Complaint Fall  Level V Caveat:  dementia  HPI Michelle Ballard is a 83 y.o. female with severe dementia and history of multiple falls presents after being found down and [facility] following just outside the elevator. Patient arrives to the ER demented and not being able private much history. Patient's 2 sons are at bedside and states that "she looks the best we've seen in a while."  Patient typically not ambulatory.  No history of known heart disease or dysrhythmia. Patient does states no pain or discomfort. She is moving about on the bed in no distress.   Past Medical History:  Diagnosis Date  . Dementia   . Depression   . Multiple falls   . Multiple pelvic fractures (HCC)   . Pneumonia   . UTI (urinary tract infection)    Family History  Problem Relation Age of Onset  . Lung cancer Mother   . Lung cancer Father   . Cirrhosis Brother    Past Surgical History:  Procedure Laterality Date  . ABDOMINAL HYSTERECTOMY    . FRACTURE SURGERY    . KYPHOPLASTY N/A 01/14/2016   Procedure: KYPHOPLASTY  T-12;  Surgeon: Kennedy Bucker, MD;  Location: [facility] ORS;  Service: Orthopedics;  Laterality: N/A;   Patient Active Problem List   Diagnosis Date Noted  . Pressure injury of skin 01/06/2016  . UTI (lower urinary tract infection) 01/05/2016  . Dehydration 01/05/2016  . Altered mental status 01/05/2016  . Dizziness 12/04/2015  . Vitamin B12 deficiency 12/04/2015  . Hypokalemia 11/16/2015  . Anemia 11/16/2015  . Dementia with behavioral disturbance 11/16/2015  . Pelvic fracture, closed, initial encounter 11/13/2015      Prior to Admission medications   Medication Sig Start Date End Date Taking? Authorizing Provider  acetaminophen (TYLENOL) 650 MG CR  tablet Take 650 mg by mouth every 8 (eight) hours.    Historical Provider, MD  aspirin 325 MG tablet Take 325 mg by mouth 2 (two) times daily.     Historical Provider, MD  cefUROXime (CEFTIN) 250 MG tablet Take 1 tablet (250 mg total) by mouth 2 (two) times daily with a meal. X 4 days Patient not taking: Reported on 01/14/2016 01/08/16   Altamese Dilling, MD  Cholecalciferol (VITAMIN D3) 2000 units TABS Take 2 capsules by mouth daily.    Historical Provider, MD  docusate sodium (COLACE) 100 MG capsule Take 100 mg by mouth 2 (two) times daily.    Historical Provider, MD  donepezil (ARICEPT) 10 MG tablet Take 10 mg by mouth 2 (two) times daily.    Historical Provider, MD  feeding supplement, ENSURE ENLIVE, (ENSURE ENLIVE) LIQD Take 237 mLs by mouth 2 (two) times daily between meals. 01/08/16   Altamese Dilling, MD  HYDROcodone-acetaminophen (NORCO) 5-325 MG tablet Take 1 tablet by mouth every 6 (six) hours as needed for moderate pain. Patient not taking: Reported on 04/11/2016 01/14/16   Kennedy Bucker, MD  HYDROcodone-acetaminophen (NORCO/VICODIN) 5-325 MG tablet Take 0.5 tablets by mouth every 6 (six) hours as needed for moderate pain or severe pain. Patient not taking: Reported on 04/11/2016 01/08/16   Altamese Dilling, MD  lidocaine (LIDODERM) 5 % Place 1 patch onto the skin every 12 (twelve) hours. Remove & Discard patch within 12  hours or as directed by MD Patient not taking: Reported on 04/11/2016 12/26/15 12/25/16  Nita Sicklearolina Veronese, MD  megestrol (MEGACE) 40 MG/ML suspension Take 10 mg by mouth daily.     Historical Provider, MD  polyethylene glycol (MIRALAX / GLYCOLAX) packet Take 17 g by mouth daily as needed for mild constipation. 11/16/15   Katharina Caperima Vaickute, MD  QUEtiapine (SEROQUEL) 50 MG tablet Take 50 mg by mouth 2 (two) times daily.     Historical Provider, MD  senna (SENOKOT) 8.6 MG TABS tablet Take 1 tablet (8.6 mg total) by mouth daily. 12/26/15   Nita Sicklearolina Veronese, MD  sertraline  (ZOLOFT) 100 MG tablet Take 100 mg by mouth daily.    Historical Provider, MD  Valproate Sodium (DEPAKENE) 250 MG/5ML SOLN solution Take 5 mLs by mouth 2 (two) times daily. 02/22/16   Historical Provider, MD    Allergies Patient has no known allergies.    Social History Social History  Substance Use Topics  . Smoking status: Former Smoker    Packs/day: 0.25    Years: 40.00    Quit date: 01/13/2015  . Smokeless tobacco: Never Used  . Alcohol use No    Review of Systems Unable to obtain:  dementia ____________________________________________   PHYSICAL EXAM:  VITAL SIGNS: Vitals:   06/18/16 1436  BP: (!) 144/84  Pulse: 79  Resp: 18  Temp: 98.5 F (36.9 C)    Constitutional: Alert Well appearing and in no acute distress. Eyes: Conjunctivae are normal. PERRL. EOMI. Head: Atraumatic. Nose: No congestion/rhinnorhea. Mouth/Throat: Mucous membranes are moist.  Oropharynx non-erythematous. Neck: No stridor. Painless ROM. No cervical spine tenderness to palpation Hematological/Lymphatic/Immunilogical: No cervical lymphadenopathy. Cardiovascular: Normal rate, regular rhythm. Grossly normal heart sounds.  Good peripheral circulation. Respiratory: Normal respiratory effort.  No retractions. Lungs CTAB. Gastrointestinal: Soft and nontender. No distention. No abdominal bruits. No CVA tenderness. Musculoskeletal: No lower extremity tenderness nor edema.  No joint effusions. Neurologic: MAE spontaneously, no facial droop. No gross focal neurologic deficits are appreciated. No gait instability. Skin:  Skin is warm, dry and intact. No rash noted.  ____________________________________________   LABS (all labs ordered are listed, but only abnormal results are displayed)  Results for orders placed or performed during the Ballard encounter of 06/18/16 (from the past 24 hour(s))  Urinalysis, Complete w Microscopic     Status: Abnormal   Collection Time: 06/18/16  4:14 PM  Result  Value Ref Range   Color, Urine YELLOW (A) YELLOW   APPearance CLEAR (A) CLEAR   Specific Gravity, Urine 1.028 1.005 - 1.030   pH 5.0 5.0 - 8.0   Glucose, UA NEGATIVE NEGATIVE mg/dL   Hgb urine dipstick NEGATIVE NEGATIVE   Bilirubin Urine NEGATIVE NEGATIVE   Ketones, ur NEGATIVE NEGATIVE mg/dL   Protein, ur NEGATIVE NEGATIVE mg/dL   Nitrite NEGATIVE NEGATIVE   Leukocytes, UA NEGATIVE NEGATIVE   RBC / HPF 0-5 0 - 5 RBC/hpf   WBC, UA 0-5 0 - 5 WBC/hpf   Bacteria, UA NONE SEEN NONE SEEN   Squamous Epithelial / LPF 0-5 (A) NONE SEEN   ____________________________________________  EKG My review and personal interpretation at Time: 16:24   Indication: fall  Rate: 85  Rhythm: sinus Axis: normal Other: normal intervals, no STEMI ____________________________________________  RADIOLOGY  declined ____________________________________________   PROCEDURES  Procedure(s) performed:  Procedures    Critical Care performed: no ____________________________________________   INITIAL IMPRESSION / ASSESSMENT AND PLAN / ED COURSE  Pertinent labs & imaging results that were available  during my care of the patient were reviewed by me and considered in my medical decision making (see chart for details).  DDX: fall, contusion, dysrhythmia, uti, tbi  Michelle Ballard is a 83 y.o. who presents to the ED with history of dementia presents as described above. Patient afebrile hemodynamically stable. On primary and secondary survey patient has no evidence of significant traumatic injury. She moving her head about without any discomfort. No evidence of hemotympanum. No focal neurologic deficits. I did discuss presentation with the patient's 2 sons. I did recommend CT imaging to evaluate for any evidence of traumatic injury as well as blood work however the patient's family state that they would not want to act on any abnormal results of that their goals are simply comfort care measures. As she does not  appear in any distress will not order CT imaging. Will check urine and EKG.    Urinalysis without any evidence of infection. EKG without any evidence of ischemia. Patient observed in the ER and remains without any worsening decline in mental status or neurodeficits. Patient's family requesting discharge home. Based on their goals of care I do feel this is reasonable and I agree to bring patient back should her symptoms change.  Have discussed with the patient and available family all diagnostics and treatments performed thus far and all questions were answered to the best of my ability. The patient demonstrates understanding and agreement with plan.   ____________________________________________   FINAL CLINICAL IMPRESSION(S) / ED DIAGNOSES  Final diagnoses:  Fall, initial encounter      NEW MEDICATIONS STARTED DURING THIS VISIT:  New Prescriptions   No medications on file     Note:  This document was prepared using Dragon voice recognition software and may include unintentional dictation errors.    Willy Eddy, MD 06/18/16 [phone]

## 2016-06-18 NOTE — Discharge Instructions (Signed)
Please return for any worsening or change in symptoms.

## 2016-06-20 DIAGNOSIS — R1310 Dysphagia, unspecified: Secondary | ICD-10-CM | POA: Diagnosis not present

## 2016-06-20 DIAGNOSIS — M6281 Muscle weakness (generalized): Secondary | ICD-10-CM | POA: Diagnosis not present

## 2016-06-20 DIAGNOSIS — M545 Low back pain: Secondary | ICD-10-CM | POA: Diagnosis not present

## 2016-06-20 DIAGNOSIS — G309 Alzheimer's disease, unspecified: Secondary | ICD-10-CM | POA: Diagnosis not present

## 2016-06-20 DIAGNOSIS — K120 Recurrent oral aphthae: Secondary | ICD-10-CM | POA: Diagnosis not present

## 2016-06-20 DIAGNOSIS — R634 Abnormal weight loss: Secondary | ICD-10-CM | POA: Diagnosis not present

## 2016-06-20 DIAGNOSIS — R300 Dysuria: Secondary | ICD-10-CM | POA: Diagnosis not present

## 2016-06-20 DIAGNOSIS — S0990XS Unspecified injury of head, sequela: Secondary | ICD-10-CM | POA: Diagnosis not present

## 2016-06-20 DIAGNOSIS — R269 Unspecified abnormalities of gait and mobility: Secondary | ICD-10-CM | POA: Diagnosis not present

## 2016-06-21 DIAGNOSIS — Z79899 Other long term (current) drug therapy: Secondary | ICD-10-CM | POA: Diagnosis not present

## 2016-06-23 DIAGNOSIS — M519 Unspecified thoracic, thoracolumbar and lumbosacral intervertebral disc disorder: Secondary | ICD-10-CM | POA: Diagnosis not present

## 2016-06-23 DIAGNOSIS — S22000D Wedge compression fracture of unspecified thoracic vertebra, subsequent encounter for fracture with routine healing: Secondary | ICD-10-CM | POA: Diagnosis not present

## 2016-06-23 DIAGNOSIS — G8929 Other chronic pain: Secondary | ICD-10-CM | POA: Diagnosis not present

## 2016-06-23 DIAGNOSIS — R2689 Other abnormalities of gait and mobility: Secondary | ICD-10-CM | POA: Diagnosis not present

## 2016-06-23 DIAGNOSIS — F0390 Unspecified dementia without behavioral disturbance: Secondary | ICD-10-CM | POA: Diagnosis not present

## 2016-06-23 DIAGNOSIS — E46 Unspecified protein-calorie malnutrition: Secondary | ICD-10-CM | POA: Diagnosis not present

## 2016-06-24 DIAGNOSIS — R2689 Other abnormalities of gait and mobility: Secondary | ICD-10-CM | POA: Diagnosis not present

## 2016-06-24 DIAGNOSIS — G8929 Other chronic pain: Secondary | ICD-10-CM | POA: Diagnosis not present

## 2016-06-24 DIAGNOSIS — E46 Unspecified protein-calorie malnutrition: Secondary | ICD-10-CM | POA: Diagnosis not present

## 2016-06-24 DIAGNOSIS — S22000D Wedge compression fracture of unspecified thoracic vertebra, subsequent encounter for fracture with routine healing: Secondary | ICD-10-CM | POA: Diagnosis not present

## 2016-06-24 DIAGNOSIS — F0390 Unspecified dementia without behavioral disturbance: Secondary | ICD-10-CM | POA: Diagnosis not present

## 2016-06-24 DIAGNOSIS — M519 Unspecified thoracic, thoracolumbar and lumbosacral intervertebral disc disorder: Secondary | ICD-10-CM | POA: Diagnosis not present

## 2016-06-27 DIAGNOSIS — F419 Anxiety disorder, unspecified: Secondary | ICD-10-CM | POA: Diagnosis not present

## 2016-06-27 DIAGNOSIS — N390 Urinary tract infection, site not specified: Secondary | ICD-10-CM | POA: Diagnosis not present

## 2016-06-27 DIAGNOSIS — E860 Dehydration: Secondary | ICD-10-CM | POA: Diagnosis not present

## 2016-06-27 DIAGNOSIS — R1310 Dysphagia, unspecified: Secondary | ICD-10-CM | POA: Diagnosis not present

## 2016-06-27 DIAGNOSIS — R451 Restlessness and agitation: Secondary | ICD-10-CM | POA: Diagnosis not present

## 2016-06-27 DIAGNOSIS — F329 Major depressive disorder, single episode, unspecified: Secondary | ICD-10-CM | POA: Diagnosis not present

## 2016-06-27 DIAGNOSIS — F0281 Dementia in other diseases classified elsewhere with behavioral disturbance: Secondary | ICD-10-CM | POA: Diagnosis not present

## 2016-06-27 DIAGNOSIS — Z66 Do not resuscitate: Secondary | ICD-10-CM | POA: Diagnosis not present

## 2017-06-05 DIAGNOSIS — R54 Age-related physical debility: Secondary | ICD-10-CM | POA: Diagnosis not present

## 2017-06-05 DIAGNOSIS — Z79899 Other long term (current) drug therapy: Secondary | ICD-10-CM | POA: Diagnosis not present

## 2017-06-05 DIAGNOSIS — R2689 Other abnormalities of gait and mobility: Secondary | ICD-10-CM | POA: Diagnosis not present

## 2017-06-05 DIAGNOSIS — G8929 Other chronic pain: Secondary | ICD-10-CM | POA: Diagnosis not present

## 2017-06-05 DIAGNOSIS — F3289 Other specified depressive episodes: Secondary | ICD-10-CM | POA: Diagnosis not present

## 2017-06-05 DIAGNOSIS — M1990 Unspecified osteoarthritis, unspecified site: Secondary | ICD-10-CM | POA: Diagnosis not present

## 2017-06-05 DIAGNOSIS — K5909 Other constipation: Secondary | ICD-10-CM | POA: Diagnosis not present

## 2017-06-05 DIAGNOSIS — F0281 Dementia in other diseases classified elsewhere with behavioral disturbance: Secondary | ICD-10-CM | POA: Diagnosis not present

## 2017-07-10 DIAGNOSIS — F329 Major depressive disorder, single episode, unspecified: Secondary | ICD-10-CM | POA: Diagnosis not present

## 2017-07-10 DIAGNOSIS — R32 Unspecified urinary incontinence: Secondary | ICD-10-CM | POA: Diagnosis not present

## 2017-07-10 DIAGNOSIS — R2689 Other abnormalities of gait and mobility: Secondary | ICD-10-CM | POA: Diagnosis not present

## 2017-07-10 DIAGNOSIS — M1990 Unspecified osteoarthritis, unspecified site: Secondary | ICD-10-CM | POA: Diagnosis not present

## 2017-07-10 DIAGNOSIS — G8929 Other chronic pain: Secondary | ICD-10-CM | POA: Diagnosis not present

## 2017-07-10 DIAGNOSIS — R54 Age-related physical debility: Secondary | ICD-10-CM | POA: Diagnosis not present

## 2017-07-10 DIAGNOSIS — K5909 Other constipation: Secondary | ICD-10-CM | POA: Diagnosis not present

## 2017-07-10 DIAGNOSIS — R1310 Dysphagia, unspecified: Secondary | ICD-10-CM | POA: Diagnosis not present

## 2017-07-24 DIAGNOSIS — R54 Age-related physical debility: Secondary | ICD-10-CM | POA: Diagnosis not present

## 2017-07-24 DIAGNOSIS — J069 Acute upper respiratory infection, unspecified: Secondary | ICD-10-CM | POA: Diagnosis not present

## 2017-07-24 DIAGNOSIS — K5909 Other constipation: Secondary | ICD-10-CM | POA: Diagnosis not present

## 2017-07-24 DIAGNOSIS — Z79899 Other long term (current) drug therapy: Secondary | ICD-10-CM | POA: Diagnosis not present

## 2017-07-24 DIAGNOSIS — F0281 Dementia in other diseases classified elsewhere with behavioral disturbance: Secondary | ICD-10-CM | POA: Diagnosis not present

## 2017-07-24 DIAGNOSIS — G8929 Other chronic pain: Secondary | ICD-10-CM | POA: Diagnosis not present

## 2017-07-24 DIAGNOSIS — M1990 Unspecified osteoarthritis, unspecified site: Secondary | ICD-10-CM | POA: Diagnosis not present

## 2017-07-24 DIAGNOSIS — R1310 Dysphagia, unspecified: Secondary | ICD-10-CM | POA: Diagnosis not present

## 2017-07-26 ENCOUNTER — Emergency Department: Payer: Medicare HMO

## 2017-07-26 ENCOUNTER — Other Ambulatory Visit: Payer: Self-pay

## 2017-07-26 ENCOUNTER — Encounter: Payer: Self-pay | Admitting: Emergency Medicine

## 2017-07-26 ENCOUNTER — Emergency Department
Admission: EM | Admit: 2017-07-26 | Discharge: 2017-07-26 | Disposition: A | Discharge status: Home / Self Care | Payer: Medicare HMO | Attending: Emergency Medicine | Admitting: Emergency Medicine

## 2017-07-26 DIAGNOSIS — Z7982 Long term (current) use of aspirin: Secondary | ICD-10-CM | POA: Diagnosis not present

## 2017-07-26 DIAGNOSIS — Z79899 Other long term (current) drug therapy: Secondary | ICD-10-CM | POA: Diagnosis not present

## 2017-07-26 DIAGNOSIS — Z87891 Personal history of nicotine dependence: Secondary | ICD-10-CM | POA: Diagnosis not present

## 2017-07-26 DIAGNOSIS — F0390 Unspecified dementia without behavioral disturbance: Secondary | ICD-10-CM | POA: Insufficient documentation

## 2017-07-26 DIAGNOSIS — R0600 Dyspnea, unspecified: Secondary | ICD-10-CM | POA: Diagnosis not present

## 2017-07-26 DIAGNOSIS — R509 Fever, unspecified: Secondary | ICD-10-CM | POA: Diagnosis not present

## 2017-07-26 DIAGNOSIS — N3000 Acute cystitis without hematuria: Secondary | ICD-10-CM | POA: Insufficient documentation

## 2017-07-26 LAB — COMPREHENSIVE METABOLIC PANEL
ALK PHOS: 57 U/L (ref 38–126)
ALT: 19 U/L (ref 14–54)
ANION GAP: 8 (ref 5–15)
AST: 38 U/L (ref 15–41)
Albumin: 3.8 g/dL (ref 3.5–5.0)
BUN: 31 mg/dL — ABNORMAL HIGH (ref 6–20)
CALCIUM: 8.9 mg/dL (ref 8.9–10.3)
CO2: 22 mmol/L (ref 22–32)
Chloride: 108 mmol/L (ref 101–111)
Creatinine, Ser: 0.88 mg/dL (ref 0.44–1.00)
GFR, EST NON AFRICAN AMERICAN: 59 mL/min — AB (ref >60)
Glucose, Bld: 119 mg/dL — ABNORMAL HIGH (ref 65–99)
Potassium: 4.4 mmol/L (ref 3.5–5.1)
SODIUM: 138 mmol/L (ref 135–145)
TOTAL PROTEIN: 7.1 g/dL (ref 6.5–8.1)
Total Bilirubin: 0.8 mg/dL (ref 0.3–1.2)

## 2017-07-26 LAB — URINALYSIS, COMPLETE (UACMP) WITH MICROSCOPIC
BACTERIA UA: NONE SEEN
Bilirubin Urine: NEGATIVE
GLUCOSE, UA: NEGATIVE mg/dL
HGB URINE DIPSTICK: NEGATIVE
Ketones, ur: 5 mg/dL — AB
LEUKOCYTES UA: NEGATIVE
NITRITE: NEGATIVE
PH: 5 (ref 5.0–8.0)
Protein, ur: 30 mg/dL — AB
SPECIFIC GRAVITY, URINE: 1.023 (ref 1.005–1.030)
Squamous Epithelial / LPF: NONE SEEN

## 2017-07-26 LAB — INFLUENZA PANEL BY PCR (TYPE A & B)
Influenza A By PCR: NEGATIVE
Influenza B By PCR: NEGATIVE

## 2017-07-26 LAB — CBC WITH DIFFERENTIAL/PLATELET
Basophils Absolute: 0 10*3/uL (ref 0–0.1)
Basophils Relative: 0 %
EOS ABS: 0.1 10*3/uL (ref 0–0.7)
Eosinophils Relative: 1 %
HCT: 35.3 % (ref 35.0–47.0)
HEMOGLOBIN: 11.1 g/dL — AB (ref 12.0–16.0)
LYMPHS ABS: 1.1 10*3/uL (ref 1.0–3.6)
Lymphocytes Relative: 9 %
MCH: 20.3 pg — AB (ref 26.0–34.0)
MCHC: 31.4 g/dL — AB (ref 32.0–36.0)
MCV: 64.6 fL — AB (ref 80.0–100.0)
MONOS PCT: 10 %
Monocytes Absolute: 1.2 10*3/uL — ABNORMAL HIGH (ref 0.2–0.9)
NEUTROS PCT: 80 %
Neutro Abs: 9.7 10*3/uL — ABNORMAL HIGH (ref 1.4–6.5)
Platelets: 331 10*3/uL (ref 150–440)
RBC: 5.46 MIL/uL — ABNORMAL HIGH (ref 3.80–5.20)
RDW: 15.9 % — ABNORMAL HIGH (ref 11.5–14.5)
WBC: 12.1 10*3/uL — ABNORMAL HIGH (ref 3.6–11.0)

## 2017-07-26 MED ORDER — CEFTRIAXONE SODIUM 1 G IJ SOLR
1.0000 g | Freq: Once | INTRAMUSCULAR | Status: AC
  Administered 2017-07-26: 1 g via INTRAVENOUS
  Filled 2017-07-26: qty 10

## 2017-07-26 MED ORDER — CEPHALEXIN 500 MG PO CAPS: 500.0000 mg | ORAL_CAPSULE | Freq: Two times a day (BID) | ORAL | 0 refills | Status: AC

## 2017-07-26 NOTE — ED Notes (Addendum)
RN called and gave report to [place], Charity fundraiserN. RN confirmed that [facility] has transportation but the transporter is on break at this time. [facility] reported they will call as soon as she returns.

## 2017-07-26 NOTE — ED Triage Notes (Signed)
Pt from [facility], where staff reports that she has had a fever off and on and has not been eating or drinking the last 2 days. Pt is from the Dementia unit.

## 2017-07-26 NOTE — ED Notes (Signed)
Pts son at bedside and has been updated by RN and MD. Pt resting comfortably. NAD at this time.

## 2017-07-26 NOTE — ED Notes (Signed)
Rn called [facility] and confirmed their transportation is back from break and is able to pick up pt.

## 2017-07-26 NOTE — ED Provider Notes (Signed)
[facility] Emergency Department Provider Note    First MD Initiated Contact with Patient 07/26/17 [phone]     (approximate)  I have reviewed the triage vital signs and the nursing notes.  level V cavity: History Limited secondary to dementia HISTORY  Chief Complaint Fever    HPI Michelle Ballard is a 84 y.o. female lwith below list of current medical conditionsincluding dementia presents to the emergency department via EMS from [facility] [place] with history per EMS which they obtained from the nursing staff of "fever off and on with poor by mouth intake 2 days. Patient afebrile on presentation temperature 97.8. Patient unable to give meaningful history.   Past Medical History:  Diagnosis Date  . Dementia   . Depression   . Multiple falls   . Multiple pelvic fractures   . Pneumonia   . UTI (urinary tract infection)     Patient Active Problem List   Diagnosis Date Noted  . Pressure injury of skin 01/06/2016  . UTI (lower urinary tract infection) 01/05/2016  . Dehydration 01/05/2016  . Altered mental status 01/05/2016  . Dizziness 12/04/2015  . Vitamin B12 deficiency 12/04/2015  . Hypokalemia 11/16/2015  . Anemia 11/16/2015  . Dementia with behavioral disturbance 11/16/2015  . Pelvic fracture, closed, initial encounter 11/13/2015    Past Surgical History:  Procedure Laterality Date  . ABDOMINAL HYSTERECTOMY    . FRACTURE SURGERY    . KYPHOPLASTY N/A 01/14/2016   Procedure: KYPHOPLASTY  T-12;  Surgeon: Kennedy Bucker, MD;  Location: [facility] ORS;  Service: Orthopedics;  Laterality: N/A;    Prior to Admission medications   Medication Sig Start Date End Date Taking? Authorizing Provider  acetaminophen (TYLENOL) 500 MG tablet Take 1,000 mg by mouth every 6 (six) hours as needed for mild pain.   Yes [provider]  aspirin 81 MG chewable tablet Chew 81 mg by mouth daily.    Yes [provider]  feeding supplement, ENSURE  ENLIVE, (ENSURE ENLIVE) LIQD Take 237 mLs by mouth 2 (two) times daily between meals. Patient taking differently: Take 1 Bottle by mouth 3 (three) times daily.  01/08/16  Yes Altamese Dilling, MD  guaiFENesin (ROBITUSSIN) 100 MG/5ML SOLN Take 20 mLs by mouth every 8 (eight) hours as needed for cough or to loosen phlegm.   Yes [provider]  polyethylene glycol (MIRALAX / GLYCOLAX) packet Take 17 g by mouth daily as needed for mild constipation. 11/16/15  Yes Katharina Caper, MD  QUEtiapine (SEROQUEL) 25 MG tablet Take 12.5 mg by mouth daily as needed (agitation).    Yes [provider]  senna (SENOKOT) 8.6 MG TABS tablet Take 1 tablet (8.6 mg total) by mouth daily. Patient taking differently: Take 1 tablet by mouth at bedtime.  12/26/15  Yes Don Perking, [place], MD  sertraline (ZOLOFT) 100 MG tablet Take 200 mg by mouth daily.    Yes [provider]  traMADol (ULTRAM) 50 MG tablet Take 25 mg by mouth daily. 07/10/17  Yes [provider]  azithromycin (ZITHROMAX) 500 MG tablet Take 500 mg by mouth daily. 07/26/17 07/30/17  [provider]  cefUROXime (CEFTIN) 250 MG tablet Take 1 tablet (250 mg total) by mouth 2 (two) times daily with a meal. X 4 days Patient not taking: Reported on 01/14/2016 01/08/16   Altamese Dilling, MD  HYDROcodone-acetaminophen (NORCO) 5-325 MG tablet Take 1 tablet by mouth every 6 (six) hours as needed for moderate pain. Patient not taking: Reported on  04/11/2016 01/14/16   Kennedy BuckerMenz, Michael, MD  HYDROcodone-acetaminophen (NORCO/VICODIN) 5-325 MG tablet Take 0.5 tablets by mouth every 6 (six) hours as needed for moderate pain or severe pain. Patient not taking: Reported on 04/11/2016 01/08/16   Altamese DillingVachhani, Vaibhavkumar, MD    Allergies no known drug allergies  Family History  Problem Relation Age of Onset  . Lung cancer Mother   . Lung cancer Father   . Cirrhosis Brother     Social History Social History   Tobacco Use  .  Smoking status: Former Smoker    Packs/day: 0.25    Years: 40.00    Pack years: 10.00    Last attempt to quit: 01/13/2015    Years since quitting: 2.5  . Smokeless tobacco: Never Used  Substance Use Topics  . Alcohol use: No  . Drug use: No    Review of Systems Constitutional: reported fever Eyes: No visual changes. ENT: No sore throat. Cardiovascular: Denies chest pain. Respiratory: Denies shortness of breath. Gastrointestinal: No abdominal pain.  No nausea, no vomiting.  No diarrhea.  No constipation. Genitourinary: Negative for dysuria. Musculoskeletal: Negative for neck pain.  Negative for back pain. Integumentary: Negative for rash. Neurological: Negative for headaches, focal weakness or numbness.  ____________________________________________   PHYSICAL EXAM:  VITAL SIGNS: ED Triage Vitals  Enc Vitals Group     BP 07/26/17 0920 (!) 146/66     Pulse Rate 07/26/17 0920 75     Resp 07/26/17 0920 18     Temp 07/26/17 0920 97.8 F (36.6 C)     Temp Source 07/26/17 0920 Axillary     SpO2 07/26/17 0920 93 %     Weight 07/26/17 0922 56.7 kg (125 lb)     Height 07/26/17 0922 1.626 m (5\' 4" )     Head Circumference --      Peak Flow --      Pain Score --      Pain Loc --      Pain Edu? --      Excl. in GC? --     Constitutional: Alert and  Well appearing and in no acute distress. Eyes: Conjunctivae are normal.  Head: Atraumatic. Mouth/Throat: Mucous membranes are moist. Oropharynx non-erythematous. Neck: No stridor.   Cardiovascular: Normal rate, regular rhythm. Good peripheral circulation. Grossly normal heart sounds. Respiratory: Normal respiratory effort.  No retractions. Lungs CTAB. Gastrointestinal: Soft and nontender. No distention.  Musculoskeletal: No lower extremity tenderness nor edema. No gross deformities of extremities. Neurologic:  Normal speech and language. No gross focal neurologic deficits are appreciated.  Skin:  Skin is warm, dry and intact. No  rash noted. Psychiatric: Mood and affect are normal. Speech and behavior are normal.  ____________________________________________   LABS (all labs ordered are listed, but only abnormal results are displayed)  Labs Reviewed  CBC WITH DIFFERENTIAL/PLATELET - Abnormal; Notable for the following components:      Result Value   WBC 12.1 (*)    RBC 5.46 (*)    Hemoglobin 11.1 (*)    MCV 64.6 (*)    MCH 20.3 (*)    MCHC 31.4 (*)    RDW 15.9 (*)    Neutro Abs 9.7 (*)    Monocytes Absolute 1.2 (*)    All other components within normal limits  COMPREHENSIVE METABOLIC PANEL  URINALYSIS, COMPLETE (UACMP) WITH MICROSCOPIC  INFLUENZA PANEL BY PCR (TYPE A & B)    RADIOLOGY I, Farnham N Vir Whetstine, personally viewed and evaluated these images (plain radiographs)  as part of my medical decision making, as well as reviewing the written report by the radiologist.  ED MD interpretation:  very mild bibasilar linear atelectasis or scarring per radiologist.  Official radiology report(s): Dg Chest Portable 1 View  Result Date: 07/26/2017 CLINICAL DATA:  Fever, decreased appetite over the last 2 days, former smoking history EXAM: PORTABLE CHEST 1 VIEW COMPARISON:  Chest x-ray of 01/05/2016 FINDINGS: No active infiltrate or effusion is seen. Mild bibasilar atelectasis or scarring is present. Mediastinal and hilar contours are unremarkable. The heart is mildly enlarged. No bony abnormality is seen. IMPRESSION: Very mild bibasilar linear atelectasis or scarring. Borderline cardiomegaly. Electronically Signed   By: Dwyane Dee M.D.   On: 07/26/2017 09:59      Procedures   ____________________________________________   INITIAL IMPRESSION / ASSESSMENT AND PLAN / ED COURSE  As part of my medical decision making, I reviewed the following data within the electronic MEDICAL RECORD NUMBER   84 year old female presenting with above stated history of physical exam of reported fever. Urinalysis consistent  withpossible urinary tract infectionold (urine culture will be obtained) and as such patient given ceftriaxone 1 g in the emergency department will be prescribed Keflex. Spoke with the patient's son and informed of all clinical findings. ____________________________________________  FINAL CLINICAL IMPRESSION(S) / ED DIAGNOSES  Final diagnoses:  Acute cystitis without hematuria     MEDICATIONS GIVEN DURING THIS VISIT:  Medications - No data to display   ED Discharge Orders    None       Note:  This document was prepared using Dragon voice recognition software and may include unintentional dictation errors.    Darci Current, MD 07/26/17 1235

## 2017-07-28 LAB — URINE CULTURE: Culture: >=100000 — AB

## 2017-07-29 NOTE — Progress Notes (Addendum)
ED Culture Report Follow up  84 yo female being treated for UTI. Pt with hx of dementia, and difficult to query for sx improvement.  UCx growing >100k aerococcus urinae. Discussed with EDP Dr. Fanny BienQuale and ID [facility]). Recommend to change abx from cephalexin to amoxicillin 500 mg PO q8h x 5 more days. (ID MD stated cephalexin does cover UTI but amoxicillin is better choice).  No pharmacy listed in the chart.  [phone]). Spoke with Harriett SineNancy at facility; awaiting callback from Kendal HymenBonnie, Director of facility to initiate med change.   Sp[facility] with [facility]al [facility]Bon[facility] at [facility] [place] about change in antibiotic; stated she will take care of the med change if order is faxed. Order sheet signed by Dr. Fanny BienQuale faxed to [phone] as requested. Called [facility]; WolvertonNancy confirmed receipt of fax and stated would give it to BallouBonnie.

## 2017-07-31 DIAGNOSIS — F0391 Unspecified dementia with behavioral disturbance: Secondary | ICD-10-CM | POA: Diagnosis not present

## 2017-07-31 DIAGNOSIS — N390 Urinary tract infection, site not specified: Secondary | ICD-10-CM | POA: Diagnosis not present

## 2017-07-31 DIAGNOSIS — R1310 Dysphagia, unspecified: Secondary | ICD-10-CM | POA: Diagnosis not present

## 2017-07-31 DIAGNOSIS — F0281 Dementia in other diseases classified elsewhere with behavioral disturbance: Secondary | ICD-10-CM | POA: Diagnosis not present

## 2017-07-31 DIAGNOSIS — Z79899 Other long term (current) drug therapy: Secondary | ICD-10-CM | POA: Diagnosis not present

## 2017-07-31 DIAGNOSIS — F419 Anxiety disorder, unspecified: Secondary | ICD-10-CM | POA: Diagnosis not present

## 2017-07-31 DIAGNOSIS — J069 Acute upper respiratory infection, unspecified: Secondary | ICD-10-CM | POA: Diagnosis not present

## 2017-07-31 DIAGNOSIS — F339 Major depressive disorder, recurrent, unspecified: Secondary | ICD-10-CM | POA: Diagnosis not present

## 2017-08-07 DIAGNOSIS — R1310 Dysphagia, unspecified: Secondary | ICD-10-CM | POA: Diagnosis not present

## 2017-08-07 DIAGNOSIS — R109 Unspecified abdominal pain: Secondary | ICD-10-CM | POA: Diagnosis not present

## 2017-08-07 DIAGNOSIS — F339 Major depressive disorder, recurrent, unspecified: Secondary | ICD-10-CM | POA: Diagnosis not present

## 2017-08-07 DIAGNOSIS — L89892 Pressure ulcer of other site, stage 2: Secondary | ICD-10-CM | POA: Diagnosis not present

## 2017-08-07 DIAGNOSIS — R54 Age-related physical debility: Secondary | ICD-10-CM | POA: Diagnosis not present

## 2017-08-07 DIAGNOSIS — F419 Anxiety disorder, unspecified: Secondary | ICD-10-CM | POA: Diagnosis not present

## 2017-08-07 DIAGNOSIS — Z79899 Other long term (current) drug therapy: Secondary | ICD-10-CM | POA: Diagnosis not present

## 2017-08-08 DIAGNOSIS — R109 Unspecified abdominal pain: Secondary | ICD-10-CM | POA: Diagnosis not present

## 2017-08-08 DIAGNOSIS — R1084 Generalized abdominal pain: Secondary | ICD-10-CM | POA: Diagnosis not present

## 2017-08-08 DIAGNOSIS — D649 Anemia, unspecified: Secondary | ICD-10-CM | POA: Diagnosis not present

## 2017-08-08 IMAGING — CT CT HEAD W/O CM
4 of 9 series · 16 of 47 positions shown, 18 images · non-contrast
Comparison: None.

CLINICAL DATA: Unwitnessed fall.  Confusion.

EXAM:
CT HEAD WITHOUT CONTRAST
CT CERVICAL SPINE WITHOUT CONTRAST
TECHNIQUE: Multidetector CT imaging of the head and cervical spine was
performed following the standard protocol without intravenous
contrast. Multiplanar CT image reconstructions of the cervical spine
were also generated.

[Series 7: coronal soft tissue · coronal · 0.28mm/px · 3 of 59 slices shown]
[im 22/59  brain]
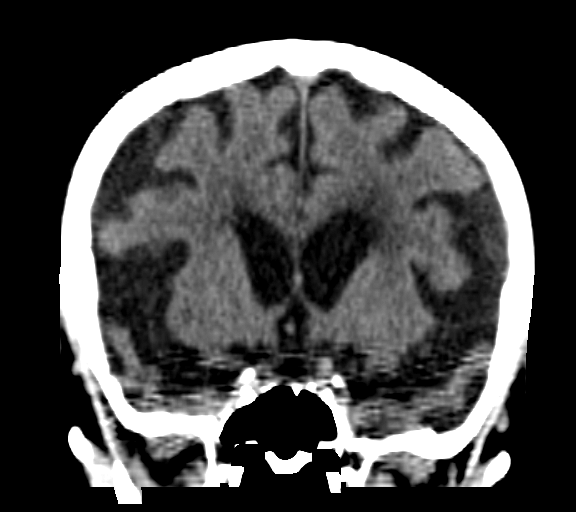
[im 30/59  brain]
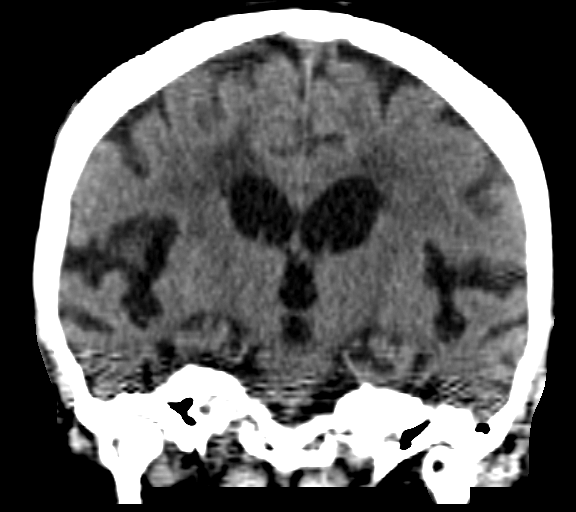
[im 37/59  brain]
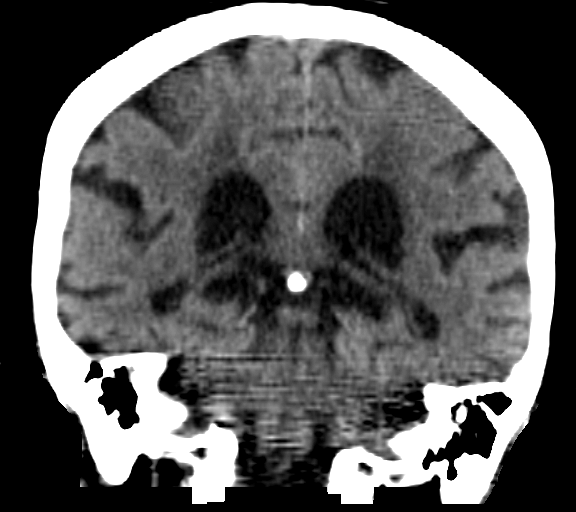

[Series 8: sagittal soft tissue · sagittal · 0.30mm/px · 2 of 51 slices shown]
[im 17/51  brain]
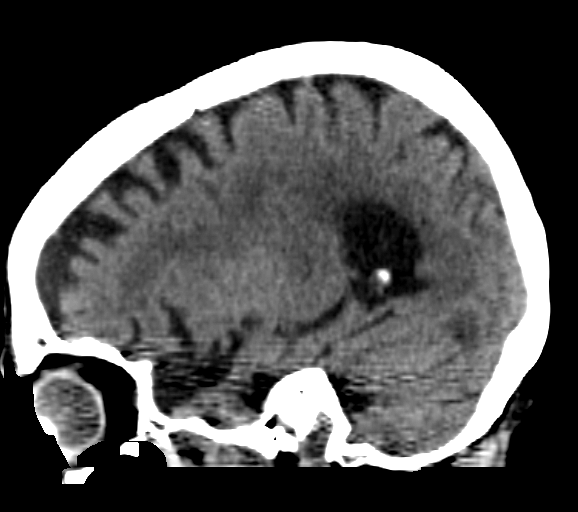
[im 34/51  brain]
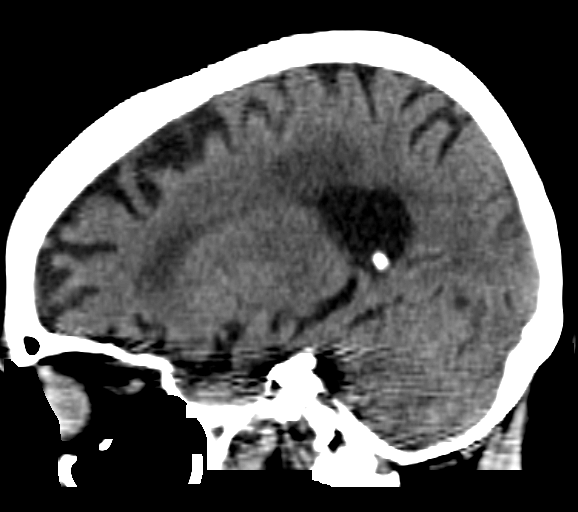

[Series 9: soft tissue · axial · 0.32mm/px · z∈[+93,+163]mm · 4 of 83 slices shown]
[im 12/83  brain]
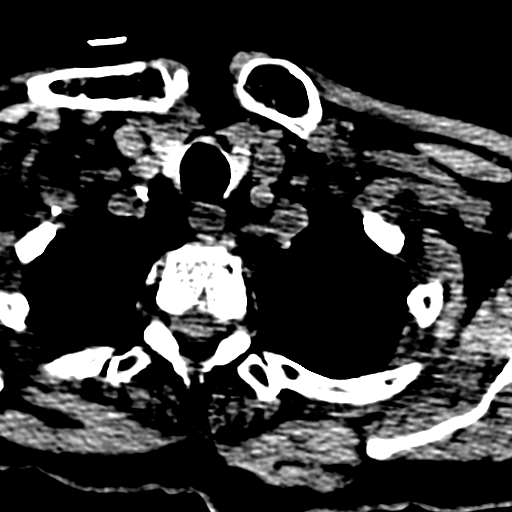
[im 24/83  brain]
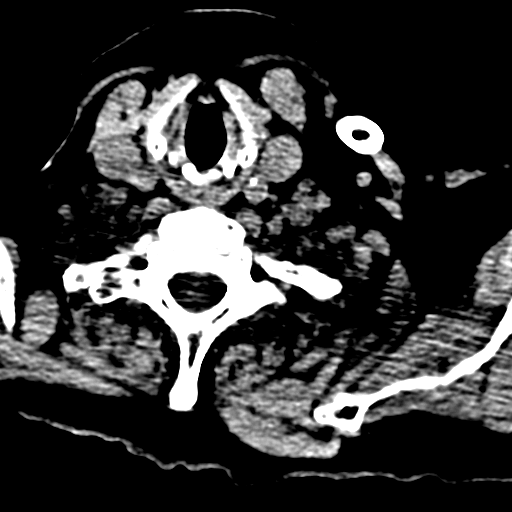
[im 36/83  brain]
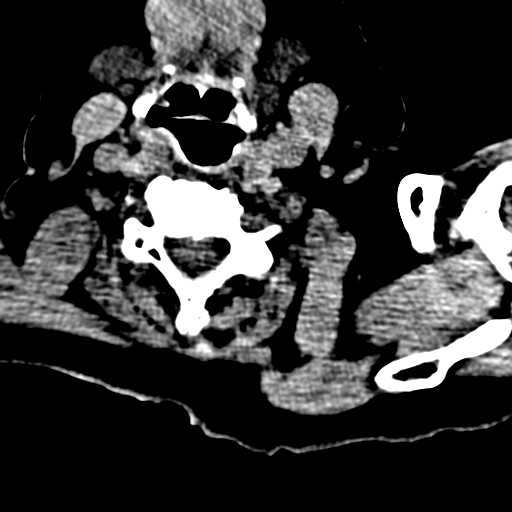
[im 47/83  brain]
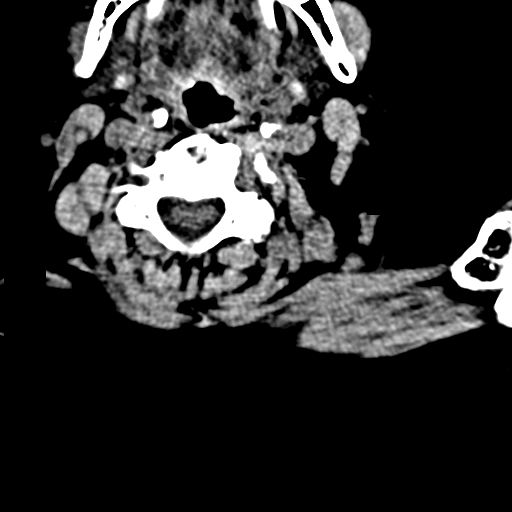

[Series 12: axial · axial · 0.31mm/px · z∈[+67,+176]mm · 7 of 84 slices shown, 9 images]
[im 11/84  brain]
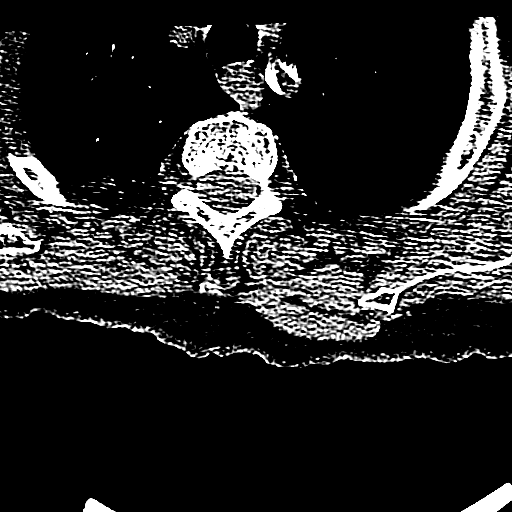
[im 11/84  bone]
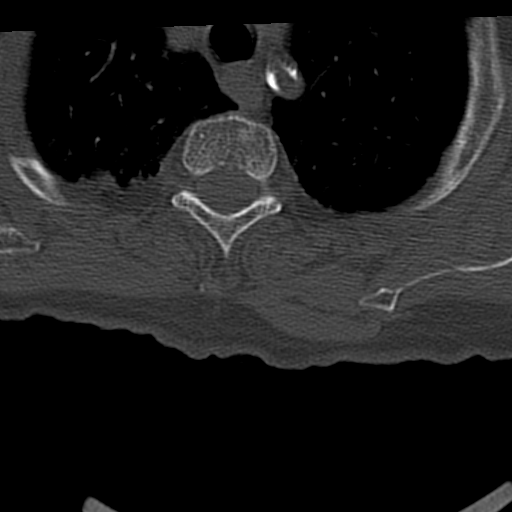
[im 21/84  brain]
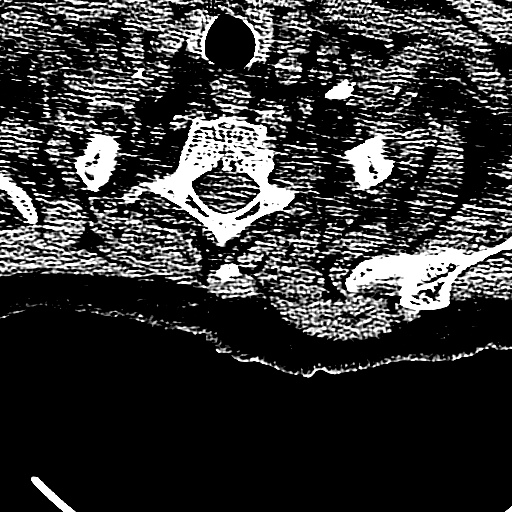
[im 32/84  brain]
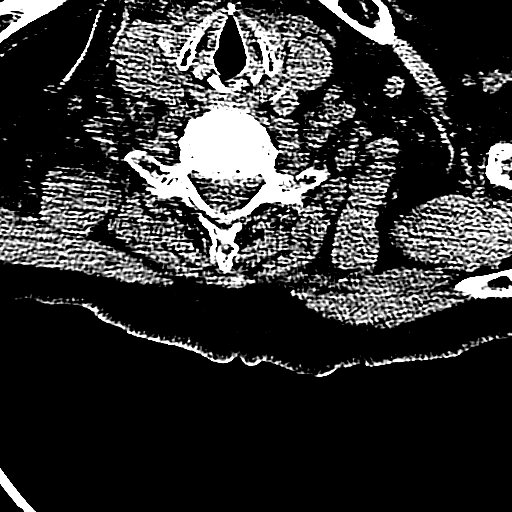
[im 42/84  brain]
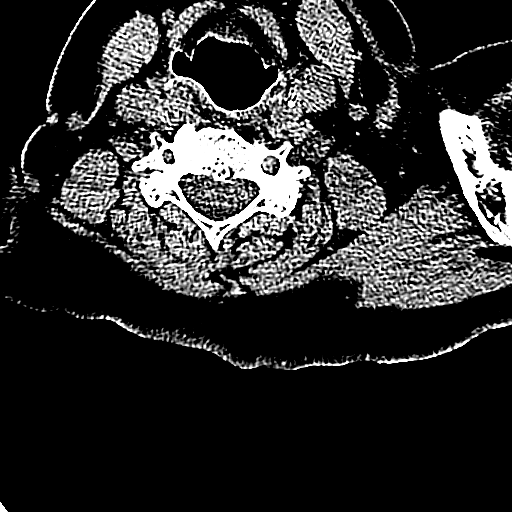
[im 52/84  brain]
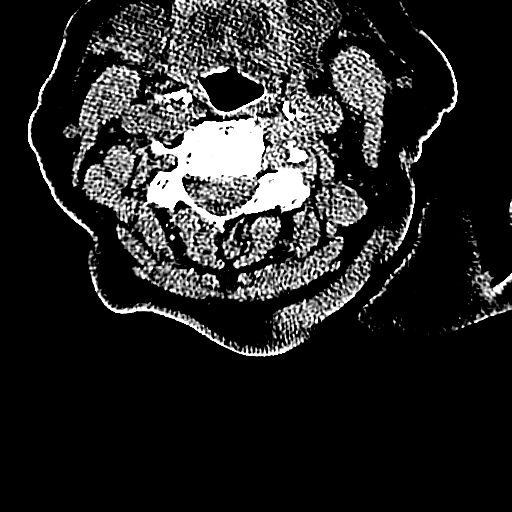
[im 52/84  bone]
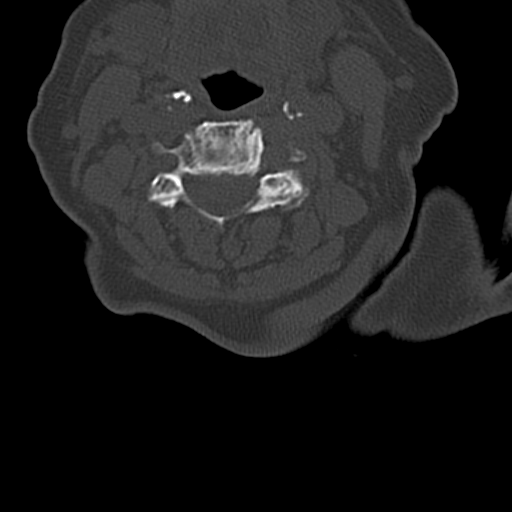
[im 63/84  brain]
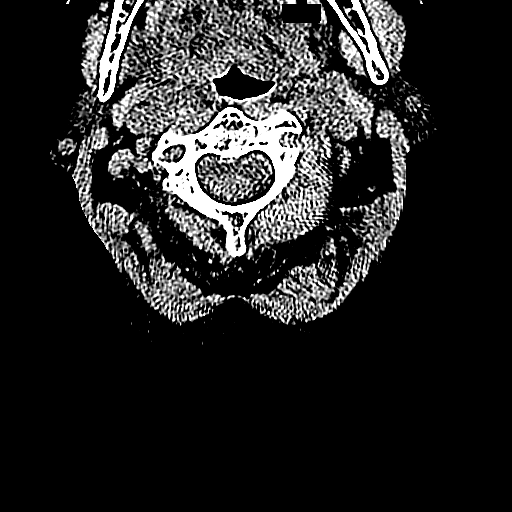
[im 73/84  brain]
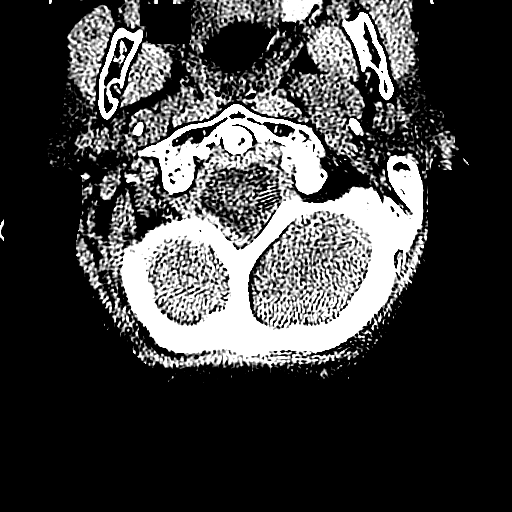

[16 of 47 positions shown; findings below may reference images not displayed]

FINDINGS: CT HEAD FINDINGS

Diffuse cerebral atrophy. Ventricular dilatation consistent with
central atrophy. Low-attenuation changes in the deep white matter
consistent with small vessel ischemia. No mass effect or midline
shift. No abnormal extra-axial fluid collections. Gray-white matter
junctions are distinct. Basal cisterns are not effaced. No evidence
of acute intracranial hemorrhage. No depressed skull fractures.
Visualized paranasal sinuses and mastoid air cells are not
opacified. Vascular calcifications.

CT CERVICAL SPINE FINDINGS

There is mild reversal of the usual cervical lordosis with slight
anterior subluxation of C2 on C3. Changes may be positional or
degenerative but ligamentous injury is not excluded. Correlation
physical examination recommended. Diffuse degenerative changes
throughout the cervical spine. Normal alignment of the cervical
facet joints with diffuse degenerative changes present.
Uncovertebral spurring causes encroachment upon the neural foramina
at multiple levels bilaterally. No vertebral compression
deformities. No prevertebral soft tissue swelling. No focal bone
lesion or bone destruction. C1-2 articulation appears intact.
Scarring in the lung apices. Vascular calcifications.
IMPRESSION: No acute intracranial abnormalities. Chronic atrophy and small
vessel ischemic changes.

Nonspecific reversal of the usual cervical lordosis with slight
anterior subluxation at C2-3. Changes likely positional or
degenerative but ligamentous injury is not excluded. Diffuse
degenerative change throughout the cervical spine. No acute
displaced fractures are identified.

## 2017-08-14 DIAGNOSIS — Z79899 Other long term (current) drug therapy: Secondary | ICD-10-CM | POA: Diagnosis not present

## 2017-08-14 DIAGNOSIS — F419 Anxiety disorder, unspecified: Secondary | ICD-10-CM | POA: Diagnosis not present

## 2017-08-14 DIAGNOSIS — F339 Major depressive disorder, recurrent, unspecified: Secondary | ICD-10-CM | POA: Diagnosis not present

## 2017-08-14 DIAGNOSIS — D563 Thalassemia minor: Secondary | ICD-10-CM | POA: Diagnosis not present

## 2017-08-14 DIAGNOSIS — R54 Age-related physical debility: Secondary | ICD-10-CM | POA: Diagnosis not present

## 2017-08-14 DIAGNOSIS — D508 Other iron deficiency anemias: Secondary | ICD-10-CM | POA: Diagnosis not present

## 2017-08-14 DIAGNOSIS — R1310 Dysphagia, unspecified: Secondary | ICD-10-CM | POA: Diagnosis not present

## 2017-08-14 DIAGNOSIS — R109 Unspecified abdominal pain: Secondary | ICD-10-CM | POA: Diagnosis not present

## 2017-08-15 DIAGNOSIS — Z79891 Long term (current) use of opiate analgesic: Secondary | ICD-10-CM | POA: Diagnosis not present

## 2017-08-15 DIAGNOSIS — R1310 Dysphagia, unspecified: Secondary | ICD-10-CM | POA: Diagnosis not present

## 2017-08-15 DIAGNOSIS — F329 Major depressive disorder, single episode, unspecified: Secondary | ICD-10-CM | POA: Diagnosis not present

## 2017-08-15 DIAGNOSIS — F0391 Unspecified dementia with behavioral disturbance: Secondary | ICD-10-CM | POA: Diagnosis not present

## 2017-08-15 DIAGNOSIS — D649 Anemia, unspecified: Secondary | ICD-10-CM | POA: Diagnosis not present

## 2017-08-15 DIAGNOSIS — Z7982 Long term (current) use of aspirin: Secondary | ICD-10-CM | POA: Diagnosis not present

## 2017-08-17 DIAGNOSIS — Z79899 Other long term (current) drug therapy: Secondary | ICD-10-CM | POA: Diagnosis not present

## 2017-08-17 DIAGNOSIS — D649 Anemia, unspecified: Secondary | ICD-10-CM | POA: Diagnosis not present

## 2017-09-04 DIAGNOSIS — F339 Major depressive disorder, recurrent, unspecified: Secondary | ICD-10-CM | POA: Diagnosis not present

## 2017-09-04 DIAGNOSIS — R54 Age-related physical debility: Secondary | ICD-10-CM | POA: Diagnosis not present

## 2017-09-04 DIAGNOSIS — R05 Cough: Secondary | ICD-10-CM | POA: Diagnosis not present

## 2017-09-04 DIAGNOSIS — F0391 Unspecified dementia with behavioral disturbance: Secondary | ICD-10-CM | POA: Diagnosis not present

## 2017-09-04 DIAGNOSIS — M5136 Other intervertebral disc degeneration, lumbar region: Secondary | ICD-10-CM | POA: Diagnosis not present

## 2017-09-04 DIAGNOSIS — R1310 Dysphagia, unspecified: Secondary | ICD-10-CM | POA: Diagnosis not present

## 2017-09-04 DIAGNOSIS — F419 Anxiety disorder, unspecified: Secondary | ICD-10-CM | POA: Diagnosis not present

## 2017-09-04 DIAGNOSIS — M5489 Other dorsalgia: Secondary | ICD-10-CM | POA: Diagnosis not present

## 2017-09-04 DIAGNOSIS — F0390 Unspecified dementia without behavioral disturbance: Secondary | ICD-10-CM | POA: Diagnosis not present

## 2017-09-18 DIAGNOSIS — B379 Candidiasis, unspecified: Secondary | ICD-10-CM | POA: Diagnosis not present

## 2017-09-18 DIAGNOSIS — F0391 Unspecified dementia with behavioral disturbance: Secondary | ICD-10-CM | POA: Diagnosis not present

## 2017-09-18 DIAGNOSIS — M5136 Other intervertebral disc degeneration, lumbar region: Secondary | ICD-10-CM | POA: Diagnosis not present

## 2017-09-18 DIAGNOSIS — F419 Anxiety disorder, unspecified: Secondary | ICD-10-CM | POA: Diagnosis not present

## 2017-10-16 DIAGNOSIS — D563 Thalassemia minor: Secondary | ICD-10-CM | POA: Diagnosis not present

## 2017-10-16 DIAGNOSIS — Z79899 Other long term (current) drug therapy: Secondary | ICD-10-CM | POA: Diagnosis not present

## 2017-10-16 DIAGNOSIS — R197 Diarrhea, unspecified: Secondary | ICD-10-CM | POA: Diagnosis not present

## 2017-10-16 DIAGNOSIS — F339 Major depressive disorder, recurrent, unspecified: Secondary | ICD-10-CM | POA: Diagnosis not present

## 2017-10-16 DIAGNOSIS — F0391 Unspecified dementia with behavioral disturbance: Secondary | ICD-10-CM | POA: Diagnosis not present

## 2017-10-23 DIAGNOSIS — Z79899 Other long term (current) drug therapy: Secondary | ICD-10-CM | POA: Diagnosis not present

## 2017-10-23 DIAGNOSIS — R197 Diarrhea, unspecified: Secondary | ICD-10-CM | POA: Diagnosis not present

## 2017-10-23 DIAGNOSIS — R1319 Other dysphagia: Secondary | ICD-10-CM | POA: Diagnosis not present

## 2017-10-23 DIAGNOSIS — R300 Dysuria: Secondary | ICD-10-CM | POA: Diagnosis not present

## 2017-10-23 DIAGNOSIS — F0391 Unspecified dementia with behavioral disturbance: Secondary | ICD-10-CM | POA: Diagnosis not present

## 2017-10-23 DIAGNOSIS — R399 Unspecified symptoms and signs involving the genitourinary system: Secondary | ICD-10-CM | POA: Diagnosis not present

## 2017-10-30 DIAGNOSIS — G894 Chronic pain syndrome: Secondary | ICD-10-CM | POA: Diagnosis not present

## 2017-10-30 DIAGNOSIS — N390 Urinary tract infection, site not specified: Secondary | ICD-10-CM | POA: Diagnosis not present

## 2017-10-30 DIAGNOSIS — Z79899 Other long term (current) drug therapy: Secondary | ICD-10-CM | POA: Diagnosis not present

## 2017-10-30 DIAGNOSIS — R251 Tremor, unspecified: Secondary | ICD-10-CM | POA: Diagnosis not present

## 2017-10-30 DIAGNOSIS — F0391 Unspecified dementia with behavioral disturbance: Secondary | ICD-10-CM | POA: Diagnosis not present

## 2017-11-02 DIAGNOSIS — R251 Tremor, unspecified: Secondary | ICD-10-CM | POA: Diagnosis not present

## 2017-11-02 DIAGNOSIS — Z79899 Other long term (current) drug therapy: Secondary | ICD-10-CM | POA: Diagnosis not present

## 2017-11-06 DIAGNOSIS — D509 Iron deficiency anemia, unspecified: Secondary | ICD-10-CM | POA: Diagnosis not present

## 2017-11-06 DIAGNOSIS — R300 Dysuria: Secondary | ICD-10-CM | POA: Diagnosis not present

## 2017-11-06 DIAGNOSIS — R251 Tremor, unspecified: Secondary | ICD-10-CM | POA: Diagnosis not present

## 2017-11-06 DIAGNOSIS — R509 Fever, unspecified: Secondary | ICD-10-CM | POA: Diagnosis not present

## 2017-11-06 DIAGNOSIS — Z79899 Other long term (current) drug therapy: Secondary | ICD-10-CM | POA: Diagnosis not present

## 2017-11-06 DIAGNOSIS — R1319 Other dysphagia: Secondary | ICD-10-CM | POA: Diagnosis not present

## 2017-11-20 DIAGNOSIS — G894 Chronic pain syndrome: Secondary | ICD-10-CM | POA: Diagnosis not present

## 2017-11-20 DIAGNOSIS — R269 Unspecified abnormalities of gait and mobility: Secondary | ICD-10-CM | POA: Diagnosis not present

## 2017-11-20 DIAGNOSIS — F0391 Unspecified dementia with behavioral disturbance: Secondary | ICD-10-CM | POA: Diagnosis not present

## 2017-11-27 DIAGNOSIS — E611 Iron deficiency: Secondary | ICD-10-CM | POA: Diagnosis not present

## 2017-11-27 DIAGNOSIS — R269 Unspecified abnormalities of gait and mobility: Secondary | ICD-10-CM | POA: Diagnosis not present

## 2017-11-27 DIAGNOSIS — G894 Chronic pain syndrome: Secondary | ICD-10-CM | POA: Diagnosis not present

## 2017-11-27 DIAGNOSIS — F0391 Unspecified dementia with behavioral disturbance: Secondary | ICD-10-CM | POA: Diagnosis not present

## 2017-12-06 DIAGNOSIS — B351 Tinea unguium: Secondary | ICD-10-CM | POA: Diagnosis not present

## 2017-12-06 DIAGNOSIS — M2040 Other hammer toe(s) (acquired), unspecified foot: Secondary | ICD-10-CM | POA: Diagnosis not present

## 2017-12-06 DIAGNOSIS — R269 Unspecified abnormalities of gait and mobility: Secondary | ICD-10-CM | POA: Diagnosis not present

## 2017-12-06 DIAGNOSIS — G894 Chronic pain syndrome: Secondary | ICD-10-CM | POA: Diagnosis not present

## 2017-12-06 DIAGNOSIS — M79673 Pain in unspecified foot: Secondary | ICD-10-CM | POA: Diagnosis not present

## 2017-12-06 DIAGNOSIS — F0391 Unspecified dementia with behavioral disturbance: Secondary | ICD-10-CM | POA: Diagnosis not present

## 2017-12-06 DIAGNOSIS — Z79899 Other long term (current) drug therapy: Secondary | ICD-10-CM | POA: Diagnosis not present

## 2017-12-06 DIAGNOSIS — N189 Chronic kidney disease, unspecified: Secondary | ICD-10-CM | POA: Diagnosis not present

## 2017-12-08 DIAGNOSIS — F339 Major depressive disorder, recurrent, unspecified: Secondary | ICD-10-CM | POA: Diagnosis not present

## 2017-12-08 DIAGNOSIS — F0391 Unspecified dementia with behavioral disturbance: Secondary | ICD-10-CM | POA: Diagnosis not present

## 2017-12-08 DIAGNOSIS — D563 Thalassemia minor: Secondary | ICD-10-CM | POA: Diagnosis not present

## 2017-12-13 DIAGNOSIS — F0391 Unspecified dementia with behavioral disturbance: Secondary | ICD-10-CM | POA: Diagnosis not present

## 2017-12-13 DIAGNOSIS — E611 Iron deficiency: Secondary | ICD-10-CM | POA: Diagnosis not present

## 2017-12-13 DIAGNOSIS — R269 Unspecified abnormalities of gait and mobility: Secondary | ICD-10-CM | POA: Diagnosis not present

## 2017-12-13 DIAGNOSIS — Z79899 Other long term (current) drug therapy: Secondary | ICD-10-CM | POA: Diagnosis not present

## 2017-12-13 DIAGNOSIS — G894 Chronic pain syndrome: Secondary | ICD-10-CM | POA: Diagnosis not present

## 2017-12-27 ENCOUNTER — Other Ambulatory Visit: Payer: Self-pay

## 2017-12-27 ENCOUNTER — Encounter: Payer: Self-pay | Admitting: Emergency Medicine

## 2017-12-27 ENCOUNTER — Emergency Department
Admission: EM | Admit: 2017-12-27 | Discharge: 2017-12-27 | Disposition: A | Discharge status: Home / Self Care | Payer: Medicare Other | Attending: Emergency Medicine | Admitting: Emergency Medicine

## 2017-12-27 DIAGNOSIS — S0181XA Laceration without foreign body of other part of head, initial encounter: Secondary | ICD-10-CM

## 2017-12-27 DIAGNOSIS — Y999 Unspecified external cause status: Secondary | ICD-10-CM | POA: Diagnosis not present

## 2017-12-27 DIAGNOSIS — Z7982 Long term (current) use of aspirin: Secondary | ICD-10-CM | POA: Insufficient documentation

## 2017-12-27 DIAGNOSIS — W06XXXA Fall from bed, initial encounter: Secondary | ICD-10-CM | POA: Diagnosis not present

## 2017-12-27 DIAGNOSIS — Z23 Encounter for immunization: Secondary | ICD-10-CM | POA: Insufficient documentation

## 2017-12-27 DIAGNOSIS — S0990XA Unspecified injury of head, initial encounter: Secondary | ICD-10-CM | POA: Diagnosis not present

## 2017-12-27 DIAGNOSIS — F0390 Unspecified dementia without behavioral disturbance: Secondary | ICD-10-CM | POA: Insufficient documentation

## 2017-12-27 DIAGNOSIS — S01112A Laceration without foreign body of left eyelid and periocular area, initial encounter: Secondary | ICD-10-CM | POA: Diagnosis not present

## 2017-12-27 DIAGNOSIS — W19XXXA Unspecified fall, initial encounter: Secondary | ICD-10-CM

## 2017-12-27 DIAGNOSIS — Y92003 Bedroom of unspecified non-institutional (private) residence as the place of occurrence of the external cause: Secondary | ICD-10-CM | POA: Diagnosis not present

## 2017-12-27 DIAGNOSIS — R296 Repeated falls: Secondary | ICD-10-CM | POA: Insufficient documentation

## 2017-12-27 DIAGNOSIS — Y9389 Activity, other specified: Secondary | ICD-10-CM | POA: Diagnosis not present

## 2017-12-27 DIAGNOSIS — Z87891 Personal history of nicotine dependence: Secondary | ICD-10-CM | POA: Diagnosis not present

## 2017-12-27 DIAGNOSIS — R4182 Altered mental status, unspecified: Secondary | ICD-10-CM | POA: Diagnosis not present

## 2017-12-27 DIAGNOSIS — Z743 Need for continuous supervision: Secondary | ICD-10-CM | POA: Diagnosis not present

## 2017-12-27 MED ORDER — ACETAMINOPHEN 325 MG PO TABS
650.0000 mg | ORAL_TABLET | Freq: Once | ORAL | Status: AC
  Administered 2017-12-27: 650 mg via ORAL
  Filled 2017-12-27: qty 2

## 2017-12-27 MED ORDER — TETANUS-DIPHTH-ACELL PERTUSSIS 5-2.5-18.5 LF-MCG/0.5 IM SUSP
0.5000 mL | Freq: Once | INTRAMUSCULAR | Status: AC
  Administered 2017-12-27: 0.5 mL via INTRAMUSCULAR
  Filled 2017-12-27: qty 0.5

## 2017-12-27 NOTE — ED Notes (Signed)
Pt's wounds cleaned at this time by this RN. Pt's son remains at bedside. Pt calm and cooperative during wound cleaning. Will continue to monitor for further patient needs.

## 2017-12-27 NOTE — ED Notes (Signed)
Called for EMS and talked with Pam.

## 2017-12-27 NOTE — ED Provider Notes (Signed)
[facility] Emergency Department Provider Note  ____________________________________________   First MD Initiated Contact with Patient 12/27/17 1736     (approximate)  I have reviewed the triage vital signs and the nursing notes.   HISTORY  Chief Complaint Fall and Head Laceration   HPI Michelle Ballard is a 84 y.o. female with a history of dementia and multiple falls who is presenting from her memory unit after a fall and a left eyebrow laceration.  Patient has dementia and knows her name but not her birthday to her location.  Her son is at the bedside and reports that the patient has had multiple falls and that she is acting at her baseline.  He thinks that the patient fell from her bed onto the floor while trying to transfer to her wheelchair because he says that he placed her in her bed this afternoon prior to leaving.  Patient without any complaints at this time.  Past Medical History:  Diagnosis Date  . Dementia   . Depression   . Multiple falls   . Multiple pelvic fractures   . Pneumonia   . UTI (urinary tract infection)     Patient Active Problem List   Diagnosis Date Noted  . Pressure injury of skin 01/06/2016  . UTI (lower urinary tract infection) 01/05/2016  . Dehydration 01/05/2016  . Altered mental status 01/05/2016  . Dizziness 12/04/2015  . Vitamin B12 deficiency 12/04/2015  . Hypokalemia 11/16/2015  . Anemia 11/16/2015  . Dementia with behavioral disturbance 11/16/2015  . Pelvic fracture, closed, initial encounter 11/13/2015    Past Surgical History:  Procedure Laterality Date  . ABDOMINAL HYSTERECTOMY    . FRACTURE SURGERY    . KYPHOPLASTY N/A 01/14/2016   Procedure: KYPHOPLASTY  T-12;  Surgeon: Kennedy Bucker, MD;  Location: [facility] ORS;  Service: Orthopedics;  Laterality: N/A;    Prior to Admission medications   Medication Sig Start Date End Date Taking? Authorizing Provider  acetaminophen (TYLENOL) 500 MG tablet Take 1,000 mg  by mouth every 8 (eight) hours.    Yes [provider]  acetaminophen (TYLENOL) 500 MG tablet Take 500 mg by mouth every 4 (four) hours as needed for fever.   Yes [provider]  alum & mag hydroxide-simeth (MINTOX) 200-200-20 MG/5ML suspension Take 30 mLs by mouth every 6 (six) hours as needed for indigestion or heartburn ((max 4 doses daily)).   Yes [provider]  aspirin 81 MG chewable tablet Chew 81 mg by mouth daily.    Yes [provider]  ferrous sulfate 325 (65 FE) MG tablet Take 325 mg by mouth daily with breakfast.   Yes [provider]  guaiFENesin (ROBITUSSIN) 100 MG/5ML SOLN Take 10 mLs by mouth every 6 (six) hours as needed for cough or to loosen phlegm.    Yes [provider]  loperamide (IMODIUM) 2 MG capsule Take by mouth as needed for diarrhea or loose stools ((max 8 doses daily)).   Yes [provider]  magnesium hydroxide (MILK OF MAGNESIA) 400 MG/5ML suspension Take 30 mLs by mouth at bedtime as needed for mild constipation or moderate constipation.   Yes [provider]  neomycin-bacitracin-polymyxin (NEOSPORIN) OINT Apply 1 application topically daily as needed for wound care ((minor skin tears/abrasions)).   Yes [provider]  senna (SENOKOT) 8.6 MG TABS tablet Take 1 tablet (8.6 mg total) by mouth daily. Patient taking differently: Take 1 tablet by mouth every other day.  12/26/15  Yes  Don Perking, [place], MD  sertraline (ZOLOFT) 100 MG tablet Take 200 mg by mouth daily.    Yes [provider]  traMADol (ULTRAM) 50 MG tablet Take 25 mg by mouth daily. 07/10/17  Yes [provider]  traMADol (ULTRAM) 50 MG tablet Take 50 mg by mouth every 4 (four) hours as needed for moderate pain ((max 3 doses daily)).   Yes [provider]  vitamin B-12 (CYANOCOBALAMIN) 1000 MCG tablet Take 1,000 mcg by mouth daily.   Yes [provider]  feeding supplement, ENSURE ENLIVE,  (ENSURE ENLIVE) LIQD Take 237 mLs by mouth 2 (two) times daily between meals. Patient not taking: Reported on 12/27/2017 01/08/16   Altamese Dilling, MD  polyethylene glycol [facility] Ethelene Hal) packet Take 17 g by mouth daily as needed for mild constipation. Patient not taking: Reported on 12/27/2017 11/16/15   Katharina Caper, MD    Allergies Patient has no known allergies.  Family History  Problem Relation Age of Onset  . Lung cancer Mother   . Lung cancer Father   . Cirrhosis Brother     Social History Social History   Tobacco Use  . Smoking status: Former Smoker    Packs/day: 0.25    Years: 40.00    Pack years: 10.00    Last attempt to quit: 01/13/2015    Years since quitting: 2.9  . Smokeless tobacco: Never Used  Substance Use Topics  . Alcohol use: No  . Drug use: No    Review of Systems  History confounded by dementia.   ____________________________________________   PHYSICAL EXAM:  VITAL SIGNS: ED Triage Vitals [12/27/17 1734]  Enc Vitals Group     BP (!) 142/56     Pulse Rate 63     Resp 14     Temp 97.7 F (36.5 C)     Temp Source Oral     SpO2 97 %     Weight 115 lb (52.2 kg)     Height 5\' 5"  (1.651 m)     Head Circumference      Peak Flow      Pain Score      Pain Loc      Pain Edu?      Excl. in GC?     Constitutional: Alert and oriented to self only.  Patient has blood in her hair as well as clotted blood on her chin. Eyes: Conjunctivae are normal.  Head: 1/2 cm laceration over the left eyebrow which is approximately 2 mm deep and without any bleeding at this time.  No bony depression.  Mild amount of ecchymosis as well periorbitally without any depression or tenderness to palpation. Nose: No congestion/rhinnorhea. Mouth/Throat: Mucous membranes are moist.  Neck: No stridor.  No tenderness to the midline cervical spine.  Patient ranges her head neck without issue and any objective outward signs of pain. Cardiovascular: Normal rate,  regular rhythm. Grossly normal heart sounds.   Respiratory: Normal respiratory effort.  No retractions. Lungs CTAB. Gastrointestinal: Soft and nontender. No distention. No CVA tenderness. Musculoskeletal: No lower extremity tenderness nor edema.  No joint effusions. 5 out of 5 strength of the bilateral lower extremity's.  No tenderness to the bilateral hips.  Pelvis is stable.  No tenderness to the thoracic or lumbar spines.  No chest wall tenderness to palpation.  No deformity or crepitus. Neurologic:  Normal speech and language. No gross focal neurologic deficits are appreciated. Skin:  Skin is warm, dry and intact. No rash noted.  Psychiatric: Mood and affect are normal. Speech and behavior are normal.  ____________________________________________   LABS (all labs ordered are listed, but only abnormal results are displayed)  Labs Reviewed - No data to display ____________________________________________  EKG   ____________________________________________  RADIOLOGY   ____________________________________________   PROCEDURES  Procedure(s) performed:    Marland Kitchen.Marland Kitchen.Laceration Repair Date/Time: 12/27/2017 7:08 PM Performed by: Myrna BlazerSchaevitz, Isley Zinni Matthew, MD Authorized by: Myrna BlazerSchaevitz, Ramaya Guile Matthew, MD   Consent:    Consent obtained:  Verbal   Consent given by:  Healthcare agent   Risks discussed:  Infection, pain, retained foreign body, poor cosmetic result and poor wound healing Anesthesia (see MAR for exact dosages):    Anesthesia method:  None Laceration details:    Location:  Face   Face location:  L eyebrow   Length (cm):  1.5   Depth (mm):  2 Repair type:    Repair type:  Simple Exploration:    Hemostasis achieved with:  Direct pressure   Wound exploration: entire depth of wound probed and visualized     Contaminated: no   Treatment:    Area cleansed with:  Saline   Amount of cleaning:  Extensive   Irrigation solution:  Sterile saline   Irrigation method:  Pressure  wash   Visualized foreign bodies/material removed: no   Skin repair:    Repair method:  Steri-Strips and tissue adhesive Approximation:    Approximation:  Close Post-procedure details:    Dressing:  Sterile dressing   Patient tolerance of procedure:  Tolerated well, no immediate complications    Critical Care performed:   ____________________________________________   INITIAL IMPRESSION / ASSESSMENT AND PLAN / ED COURSE  Pertinent labs & imaging results that were available during my care of the patient were reviewed by me and considered in my medical decision making (see chart for details).  DDX: Mechanical fall, intrarenal hemorrhage, skull fracture, cervical spine fracture, contusion, laceration As part of my medical decision making, I reviewed the following data within the electronic MEDICAL RECORD NUMBER Notes from prior ED visits  ----------------------------------------- 7:09 PM on 12/27/2017 -----------------------------------------  I had a discussion with with the son regarding imaging for the patient.  We agreed not to admit the patient as she has dementia as well as a DNR and that there would likely be no subsequent intervention if a positive imaging study was detected.  The laceration was repaired with Dermabond.  Patient is calm and cooperative at this time.  We will update her tetanus shot.  Patient be discharged to home. ____________________________________________   FINAL CLINICAL IMPRESSION(S) / ED DIAGNOSES  Fall.  Eyebrow laceration.  NEW MEDICATIONS STARTED DURING THIS VISIT:  New Prescriptions   No medications on file     Note:  This document was prepared using Dragon voice recognition software and may include unintentional dictation errors.     Myrna BlazerSchaevitz, Diania Co Matthew, MD 12/27/17 1910

## 2017-12-27 NOTE — ED Triage Notes (Signed)
Pt presents to ED via [facility] from [facility] Memory care unit s/p unwitnessed fall while transferring from bed to wheelchair. Pt has hx of dementia and per EMS is at baseline. Pt with noted blood to her hair and laceration to her L eyebrow.

## 2018-01-03 DIAGNOSIS — R269 Unspecified abnormalities of gait and mobility: Secondary | ICD-10-CM | POA: Diagnosis not present

## 2018-01-03 DIAGNOSIS — G894 Chronic pain syndrome: Secondary | ICD-10-CM | POA: Diagnosis not present

## 2018-01-03 DIAGNOSIS — E611 Iron deficiency: Secondary | ICD-10-CM | POA: Diagnosis not present

## 2018-01-03 DIAGNOSIS — F0391 Unspecified dementia with behavioral disturbance: Secondary | ICD-10-CM | POA: Diagnosis not present

## 2018-02-03 ENCOUNTER — Emergency Department
Admission: EM | Admit: 2018-02-03 | Discharge: 2018-02-03 | Disposition: A | Discharge status: Home / Self Care | Payer: Medicare Other | Attending: Emergency Medicine | Admitting: Emergency Medicine

## 2018-02-03 ENCOUNTER — Encounter: Payer: Self-pay | Admitting: *Deleted

## 2018-02-03 ENCOUNTER — Emergency Department: Payer: Medicare Other

## 2018-02-03 DIAGNOSIS — Z87891 Personal history of nicotine dependence: Secondary | ICD-10-CM | POA: Diagnosis not present

## 2018-02-03 DIAGNOSIS — Y999 Unspecified external cause status: Secondary | ICD-10-CM | POA: Insufficient documentation

## 2018-02-03 DIAGNOSIS — S0101XA Laceration without foreign body of scalp, initial encounter: Secondary | ICD-10-CM | POA: Diagnosis not present

## 2018-02-03 DIAGNOSIS — F0390 Unspecified dementia without behavioral disturbance: Secondary | ICD-10-CM | POA: Diagnosis not present

## 2018-02-03 DIAGNOSIS — W050XXA Fall from non-moving wheelchair, initial encounter: Secondary | ICD-10-CM | POA: Insufficient documentation

## 2018-02-03 DIAGNOSIS — Y939 Activity, unspecified: Secondary | ICD-10-CM | POA: Insufficient documentation

## 2018-02-03 DIAGNOSIS — Y92129 Unspecified place in nursing home as the place of occurrence of the external cause: Secondary | ICD-10-CM | POA: Diagnosis not present

## 2018-02-03 DIAGNOSIS — S0990XA Unspecified injury of head, initial encounter: Secondary | ICD-10-CM | POA: Diagnosis not present

## 2018-02-03 DIAGNOSIS — R4182 Altered mental status, unspecified: Secondary | ICD-10-CM | POA: Diagnosis not present

## 2018-02-03 DIAGNOSIS — W19XXXA Unspecified fall, initial encounter: Secondary | ICD-10-CM

## 2018-02-03 DIAGNOSIS — S0003XA Contusion of scalp, initial encounter: Secondary | ICD-10-CM | POA: Insufficient documentation

## 2018-02-03 NOTE — ED Triage Notes (Signed)
Per EMS report, patient had a witnessed fall by sliding out of her wheelchair. Patient has a history of falls. Patient is at baseline per staff at nursing home. Patient has blood on her hair. Wound is being cleaned to visualize. Per EMS report, patient has emesis in her hair also. Patient arrived alert, verbal, oriented to self.

## 2018-02-03 NOTE — ED Notes (Signed)
Patient left with her son to go back to [employer].

## 2018-02-03 NOTE — ED Notes (Signed)
Report given to [facility]. Patient's son is at bedside waiting for the ambulance.

## 2018-02-03 NOTE — ED Notes (Signed)
Patient's son states he cannot take his mother home by himself. MD aware

## 2018-02-03 NOTE — ED Notes (Signed)
Pt son approaches nurses station raising his voice to me concerning not wanting to wait for ambulance stating he cannot transport pt in his car. Yells, "I am not waiting around here all damn night."  Beth, CNA present. Son approaches 2 min later yelling again to "get her out of here, I will just take her myself." Politely explained I will inform assigned RN.

## 2018-02-03 NOTE — ED Notes (Signed)
Son remains at bedside while waiting for ambulance.

## 2018-02-03 NOTE — ED Provider Notes (Signed)
[facility] Emergency Department Provider Note       Time seen: ----------------------------------------- 4:38 PM on 02/03/2018 ----------------------------------------- Level V caveat: History/ROS limited by dementia  I have reviewed the triage vital signs and the nursing notes.  HISTORY   Chief Complaint No chief complaint on file.    HPI Michelle Ballard is a 84 y.o. female with a history of dementia, depression, multiple falls, pneumonia, UTI who presents to the ED for a fall.  Patient was witnessed sliding out of her wheelchair.  She has a long history of falls and is at her baseline.  She had the back of her head and was noted to have some bleeding.  No further information was available.  Past Medical History:  Diagnosis Date  . Dementia   . Depression   . Multiple falls   . Multiple pelvic fractures   . Pneumonia   . UTI (urinary tract infection)     Patient Active Problem List   Diagnosis Date Noted  . Pressure injury of skin 01/06/2016  . UTI (lower urinary tract infection) 01/05/2016  . Dehydration 01/05/2016  . Altered mental status 01/05/2016  . Dizziness 12/04/2015  . Vitamin B12 deficiency 12/04/2015  . Hypokalemia 11/16/2015  . Anemia 11/16/2015  . Dementia with behavioral disturbance (HCC) 11/16/2015  . Pelvic fracture, closed, initial encounter 11/13/2015    Past Surgical History:  Procedure Laterality Date  . ABDOMINAL HYSTERECTOMY    . FRACTURE SURGERY    . KYPHOPLASTY N/A 01/14/2016   Procedure: KYPHOPLASTY  T-12;  Surgeon: Kennedy Bucker, MD;  Location: [facility] ORS;  Service: Orthopedics;  Laterality: N/A;    Allergies Patient has no known allergies.  Social History Social History   Tobacco Use  . Smoking status: Former Smoker    Packs/day: 0.25    Years: 40.00    Pack years: 10.00    Last attempt to quit: 01/13/2015    Years since quitting: 3.0  . Smokeless tobacco: Never Used  Substance Use Topics  . Alcohol  use: No  . Drug use: No   Review of Systems Unknown, positive for a fall, head injury and posterior scalp bleeding  All systems negative/normal/unremarkable except as stated in the HPI  ____________________________________________   PHYSICAL EXAM:  VITAL SIGNS: ED Triage Vitals  Enc Vitals Group     BP      Pulse      Resp      Temp      Temp src      SpO2      Weight      Height      Head Circumference      Peak Flow      Pain Score      Pain Loc      Pain Edu?      Excl. in GC?    Constitutional: Alert but disoriented.  No obvious distress Eyes: Conjunctivae are normal. Normal extraocular movements. ENT   Head: Normocephalic, occipital scalp contusion and 1 cm laceration with active bleeding   Nose: No congestion/rhinnorhea.   Mouth/Throat: Mucous membranes are moist.   Neck: No stridor. Cardiovascular: Normal rate, regular rhythm. No murmurs, rubs, or gallops. Respiratory: Normal respiratory effort without tachypnea nor retractions. Breath sounds are clear and equal bilaterally. No wheezes/rales/rhonchi. Gastrointestinal: Soft and nontender. Musculoskeletal: No lower extremity tenderness nor edema. Neurologic:  Normal speech and language. No gross focal neurologic deficits are appreciated.  Skin: Occipital scalp laceration and contusion ____________________________________________  ED COURSE:  As part of my medical decision making, I reviewed the following data within the electronic MEDICAL RECORD NUMBER History obtained from family if available, nursing notes, old chart and ekg, as well as notes from prior ED visits. Patient presented for a witnessed fall, we will assess with imaging as indicated at this time.   Marland Kitchen.Laceration Repair Date/Time: 02/03/2018 4:57 PM Performed by: Emily Filbert, MD Authorized by: Emily Filbert, MD   Consent:    Consent obtained:  Emergent situation   Consent given by:  Patient Anesthesia (see MAR for exact  dosages):    Anesthesia method:  None Laceration details:    Length (cm):  1   Depth (mm):  5 Repair type:    Repair type:  Simple Exploration:    Hemostasis achieved with:  Direct pressure   Contaminated: no   Treatment:    Area cleansed with:  Saline   Irrigation solution:  Tap water   Visualized foreign bodies/material removed: no   Skin repair:    Repair method:  Staples   Number of staples:  1 Approximation:    Approximation:  Close Post-procedure details:    Dressing:  Open (no dressing)   Patient tolerance of procedure:  Tolerated well, no immediate complications   ____________________________________________   RADIOLOGY Images were viewed by me  CT head Is unremarkable ____________________________________________  DIFFERENTIAL DIAGNOSIS   Contusion, laceration, minor head injury  FINAL ASSESSMENT AND PLAN  Fall, minor head injury, scalp laceration   Plan: The patient had presented for a fall with a minor scalp contusion and laceration.  Patient's imaging did not reveal any acute process.  One staple was placed in this laceration with hemostasis.  She is stable for outpatient follow-up.   Michelle Dash, MD   Note: This note was generated in part or whole with voice recognition software. Voice recognition is usually quite accurate but there are transcription errors that can and very often do occur. I apologize for any typographical errors that were not detected and corrected.     Emily Filbert, MD 02/03/18 [phone]

## 2018-02-03 NOTE — ED Notes (Signed)
Patient taken to CT scan.

## 2018-02-07 DIAGNOSIS — G894 Chronic pain syndrome: Secondary | ICD-10-CM | POA: Diagnosis not present

## 2018-02-07 DIAGNOSIS — F0391 Unspecified dementia with behavioral disturbance: Secondary | ICD-10-CM | POA: Diagnosis not present

## 2018-02-07 DIAGNOSIS — R269 Unspecified abnormalities of gait and mobility: Secondary | ICD-10-CM | POA: Diagnosis not present

## 2018-02-08 DIAGNOSIS — I739 Peripheral vascular disease, unspecified: Secondary | ICD-10-CM | POA: Diagnosis not present

## 2018-02-08 DIAGNOSIS — B351 Tinea unguium: Secondary | ICD-10-CM | POA: Diagnosis not present

## 2018-02-08 DIAGNOSIS — L603 Nail dystrophy: Secondary | ICD-10-CM | POA: Diagnosis not present

## 2018-03-06 DIAGNOSIS — D509 Iron deficiency anemia, unspecified: Secondary | ICD-10-CM | POA: Diagnosis not present

## 2018-03-06 DIAGNOSIS — D519 Vitamin B12 deficiency anemia, unspecified: Secondary | ICD-10-CM | POA: Diagnosis not present

## 2018-10-21 DIAGNOSIS — D509 Iron deficiency anemia, unspecified: Secondary | ICD-10-CM | POA: Diagnosis not present

## 2018-10-21 DIAGNOSIS — D563 Thalassemia minor: Secondary | ICD-10-CM | POA: Diagnosis not present

## 2018-10-21 DIAGNOSIS — N189 Chronic kidney disease, unspecified: Secondary | ICD-10-CM | POA: Diagnosis not present

## 2018-10-21 DIAGNOSIS — Z79899 Other long term (current) drug therapy: Secondary | ICD-10-CM | POA: Diagnosis not present

## 2018-10-21 DIAGNOSIS — E559 Vitamin D deficiency, unspecified: Secondary | ICD-10-CM | POA: Diagnosis not present

## 2018-10-21 DIAGNOSIS — D508 Other iron deficiency anemias: Secondary | ICD-10-CM | POA: Diagnosis not present

## 2018-10-21 DIAGNOSIS — E611 Iron deficiency: Secondary | ICD-10-CM | POA: Diagnosis not present

## 2018-11-18 ENCOUNTER — Encounter: Payer: Self-pay | Admitting: Emergency Medicine

## 2018-11-18 ENCOUNTER — Other Ambulatory Visit: Payer: Self-pay

## 2018-11-18 ENCOUNTER — Emergency Department: Payer: Medicare Other

## 2018-11-18 ENCOUNTER — Emergency Department
Admission: EM | Admit: 2018-11-18 | Discharge: 2018-11-18 | Disposition: A | Discharge status: Home / Self Care | Payer: Medicare Other | Attending: Emergency Medicine | Admitting: Emergency Medicine

## 2018-11-18 DIAGNOSIS — M2550 Pain in unspecified joint: Secondary | ICD-10-CM | POA: Diagnosis not present

## 2018-11-18 DIAGNOSIS — N390 Urinary tract infection, site not specified: Secondary | ICD-10-CM | POA: Insufficient documentation

## 2018-11-18 DIAGNOSIS — F0390 Unspecified dementia without behavioral disturbance: Secondary | ICD-10-CM | POA: Diagnosis not present

## 2018-11-18 DIAGNOSIS — S022XXA Fracture of nasal bones, initial encounter for closed fracture: Secondary | ICD-10-CM | POA: Diagnosis not present

## 2018-11-18 DIAGNOSIS — Z79899 Other long term (current) drug therapy: Secondary | ICD-10-CM | POA: Diagnosis not present

## 2018-11-18 DIAGNOSIS — Z7982 Long term (current) use of aspirin: Secondary | ICD-10-CM | POA: Insufficient documentation

## 2018-11-18 DIAGNOSIS — R404 Transient alteration of awareness: Secondary | ICD-10-CM | POA: Diagnosis not present

## 2018-11-18 DIAGNOSIS — W0110XA Fall on same level from slipping, tripping and stumbling with subsequent striking against unspecified object, initial encounter: Secondary | ICD-10-CM | POA: Diagnosis not present

## 2018-11-18 DIAGNOSIS — Y92129 Unspecified place in nursing home as the place of occurrence of the external cause: Secondary | ICD-10-CM | POA: Diagnosis not present

## 2018-11-18 DIAGNOSIS — S0992XA Unspecified injury of nose, initial encounter: Secondary | ICD-10-CM | POA: Diagnosis present

## 2018-11-18 DIAGNOSIS — Z87891 Personal history of nicotine dependence: Secondary | ICD-10-CM | POA: Diagnosis not present

## 2018-11-18 DIAGNOSIS — Y998 Other external cause status: Secondary | ICD-10-CM | POA: Diagnosis not present

## 2018-11-18 DIAGNOSIS — Z7401 Bed confinement status: Secondary | ICD-10-CM | POA: Diagnosis not present

## 2018-11-18 DIAGNOSIS — R5381 Other malaise: Secondary | ICD-10-CM | POA: Diagnosis not present

## 2018-11-18 DIAGNOSIS — Y939 Activity, unspecified: Secondary | ICD-10-CM | POA: Diagnosis not present

## 2018-11-18 DIAGNOSIS — W19XXXA Unspecified fall, initial encounter: Secondary | ICD-10-CM | POA: Diagnosis not present

## 2018-11-18 LAB — COMPREHENSIVE METABOLIC PANEL
ALT: 14 U/L (ref 0–44)
AST: 20 U/L (ref 15–41)
Albumin: 3.7 g/dL (ref 3.5–5.0)
Alkaline Phosphatase: 42 U/L (ref 38–126)
Anion gap: 9 (ref 5–15)
BUN: 24 mg/dL — ABNORMAL HIGH (ref 8–23)
CO2: 26 mmol/L (ref 22–32)
Calcium: 9.1 mg/dL (ref 8.9–10.3)
Chloride: 107 mmol/L (ref 98–111)
Creatinine, Ser: 0.53 mg/dL (ref 0.44–1.00)
GFR calc Af Amer: >60 mL/min (ref >60)
GFR calc non Af Amer: >60 mL/min (ref >60)
Glucose, Bld: 105 mg/dL — ABNORMAL HIGH (ref 70–99)
Potassium: 3.9 mmol/L (ref 3.5–5.1)
Sodium: 142 mmol/L (ref 135–145)
Total Bilirubin: 0.8 mg/dL (ref 0.3–1.2)
Total Protein: 6.6 g/dL (ref 6.5–8.1)

## 2018-11-18 LAB — URINALYSIS, COMPLETE (UACMP) WITH MICROSCOPIC
Bilirubin Urine: NEGATIVE
Glucose, UA: NEGATIVE mg/dL
Hgb urine dipstick: NEGATIVE
Ketones, ur: NEGATIVE mg/dL
Nitrite: POSITIVE — AB
Protein, ur: NEGATIVE mg/dL
Specific Gravity, Urine: 1.017 (ref 1.005–1.030)
pH: 5 (ref 5.0–8.0)

## 2018-11-18 LAB — CBC
HCT: 37.2 % (ref 36.0–46.0)
Hemoglobin: 11.2 g/dL — ABNORMAL LOW (ref 12.0–15.0)
MCH: 19.6 pg — ABNORMAL LOW (ref 26.0–34.0)
MCHC: 30.1 g/dL (ref 30.0–36.0)
MCV: 65.3 fL — ABNORMAL LOW (ref 80.0–100.0)
Platelets: 314 10*3/uL (ref 150–400)
RBC: 5.7 MIL/uL — ABNORMAL HIGH (ref 3.87–5.11)
RDW: 17 % — ABNORMAL HIGH (ref 11.5–15.5)
WBC: 9.1 10*3/uL (ref 4.0–10.5)
nRBC: 0.3 % — ABNORMAL HIGH (ref 0.0–0.2)

## 2018-11-18 LAB — TROPONIN I (HIGH SENSITIVITY): Troponin I (High Sensitivity): 4 ng/L (ref <18)

## 2018-11-18 MED ORDER — SODIUM CHLORIDE 0.9 % IV SOLN
1.0000 g | Freq: Once | INTRAVENOUS | Status: AC
  Administered 2018-11-18: 1 g via INTRAVENOUS
  Filled 2018-11-18: qty 10

## 2018-11-18 MED ORDER — CEPHALEXIN 500 MG PO CAPS: 500.0000 mg | ORAL_CAPSULE | Freq: Two times a day (BID) | ORAL | 0 refills | Status: AC

## 2018-11-18 NOTE — ED Triage Notes (Signed)
EMS pt to rm 24 from [facility]. Pt found on floor under her bed with blood from her nose, bruising and swelling to her nose and skin tears to her right hand. Pt with hx of dementia and is unable to tell what happened.

## 2018-11-18 NOTE — ED Provider Notes (Signed)
-----------------------------------------   9:08 AM on 11/18/2018 -----------------------------------------  I took over care on this patient from Dr. Owens Shark.  The patient was pending lab work-up and CT.  CTs reveal nasal bone fractures but no cervical spine fracture and no intracranial hemorrhage or other concerning traumatic findings.  The lab work-up is unremarkable, however the urinalysis does show WBCs and is positive for nitrates.  This is suggestive of a UTI.  I will give a dose of ceftriaxone here and discharged with Keflex.  On reassessment, the patient is resting comfortably.  Her vital signs remained stable.  On my exam the patient has no nasal septal hematoma.  I cleared her from the c-collar.  At this time, there is no indication for further ED monitoring or admission.  The patient is stable for discharge to her facility.  Return precautions and a prescription have been provided.     Arta Silence, MD 11/18/18 [phone]

## 2018-11-18 NOTE — ED Provider Notes (Signed)
[facility] Emergency Department Provider Note  ____________________________________________   First MD Initiated Contact with Patient 11/18/18 [phone]     (approximate)  I have reviewed the triage vital signs and the nursing notes.  Level 5 caveat: History review of systems limited secondary to dementia HISTORY  Chief Complaint Fall    HPI Michelle Ballard is a 85 y.o. female with below list of previous medical conditions including dementia with multiple falls presents to the emergency department via EMS from [place].  Patient was found on the floor at approximately 530 this morning blood noted on the face and hair per EMS.  Staff states patient last seen last night.       Past Medical History:  Diagnosis Date  . Dementia ([place])   . Depression   . Multiple falls   . Multiple pelvic fractures   . Pneumonia   . UTI (urinary tract infection)     Patient Active Problem List   Diagnosis Date Noted  . Pressure injury of skin 01/06/2016  . UTI (lower urinary tract infection) 01/05/2016  . Dehydration 01/05/2016  . Altered mental status 01/05/2016  . Dizziness 12/04/2015  . Vitamin B12 deficiency 12/04/2015  . Hypokalemia 11/16/2015  . Anemia 11/16/2015  . Dementia with behavioral disturbance ([place]) 11/16/2015  . Pelvic fracture, closed, initial encounter 11/13/2015    Past Surgical History:  Procedure Laterality Date  . ABDOMINAL HYSTERECTOMY    . FRACTURE SURGERY    . KYPHOPLASTY N/A 01/14/2016   Procedure: KYPHOPLASTY  T-12;  Surgeon: Hessie Knows, MD;  Location: [facility] ORS;  Service: Orthopedics;  Laterality: N/A;    Prior to Admission medications   Medication Sig Start Date End Date Taking? Authorizing Provider  acetaminophen (TYLENOL) 500 MG tablet Take 1,000 mg by mouth every 8 (eight) hours.     [provider]  acetaminophen (TYLENOL) 500 MG tablet Take 500 mg by mouth every 4 (four) hours as needed for fever.    [provider]  alum & mag hydroxide-simeth ([place]) 200-200-20 MG/5ML suspension Take 30 mLs by mouth every 6 (six) hours as needed for indigestion or heartburn ((max 4 doses daily)).    [provider]  aspirin 81 MG chewable tablet Chew 81 mg by mouth daily.     [provider]  feeding supplement, ENSURE ENLIVE, (ENSURE ENLIVE) LIQD Take 237 mLs by mouth 2 (two) times daily between meals. Patient not taking: Reported on 12/27/2017 01/08/16   Vaughan Basta, MD  ferrous sulfate 325 (65 FE) MG tablet Take 325 mg by mouth daily with breakfast.    [provider]  guaiFENesin (ROBITUSSIN) 100 MG/5ML SOLN Take 10 mLs by mouth every 6 (six) hours as needed for cough or to loosen phlegm.     [provider]  loperamide (IMODIUM) 2 MG capsule Take by mouth as needed for diarrhea or loose stools ((max 8 doses daily)).    [provider]  magnesium hydroxide (MILK OF MAGNESIA) 400 MG/5ML suspension Take 30 mLs by mouth at bedtime as needed for mild constipation or moderate constipation.    [provider]  neomycin-bacitracin-polymyxin (NEOSPORIN) OINT Apply 1 application topically daily as needed for wound care ((minor skin tears/abrasions)).    [provider]  polyethylene glycol (MIRALAX / GLYCOLAX) packet Take 17 g by mouth daily as needed for mild constipation. Patient not taking: Reported on 12/27/2017 11/16/15   Theodoro Grist, MD  senna (SENOKOT) 8.6 MG TABS tablet Take 1  tablet (8.6 mg total) by mouth daily. Patient taking differently: Take 1 tablet by mouth every other day.  12/26/15   Nita SickleVeronese, [place], MD  sertraline (ZOLOFT) 100 MG tablet Take 200 mg by mouth daily.     [provider]  traMADol (ULTRAM) 50 MG tablet Take 25 mg by mouth daily. 07/10/17   [provider]  traMADol (ULTRAM) 50 MG tablet Take 50 mg by mouth every 4 (four) hours as needed for moderate pain ((max 3 doses daily)).    [provider]  vitamin B-12 (CYANOCOBALAMIN) 1000 MCG tablet Take 1,000 mcg by mouth daily.    [provider]    Allergies Patient has no known allergies.  Family History  Problem Relation Age of Onset  . Lung cancer Mother   . Lung cancer Father   . Cirrhosis Brother     Social History Social History   Tobacco Use  . Smoking status: Former Smoker    Packs/day: 0.25    Years: 40.00    Pack years: 10.00    Quit date: 01/13/2015    Years since quitting: 3.8  . Smokeless tobacco: Never Used  Substance Use Topics  . Alcohol use: No  . Drug use: No    Review of Systems Constitutional: No fever/chills Eyes: No visual changes. ENT: No sore throat. Cardiovascular: Denies chest pain. Respiratory: Denies shortness of breath. Gastrointestinal: No abdominal pain.  No nausea, no vomiting.  No diarrhea.  No constipation. Genitourinary: Negative for dysuria. Musculoskeletal: Negative for neck pain.  Negative for back pain. Integumentary: Negative for rash. Neurological: Negative for headaches, focal weakness or numbness.   ____________________________________________   PHYSICAL EXAM:  VITAL SIGNS: ED Triage Vitals  Enc Vitals Group     BP 11/18/18 0612 (!) 160/61     Pulse Rate 11/18/18 0612 82     Resp 11/18/18 0612 16     Temp 11/18/18 0612 (!) 97.5 F (36.4 C)     Temp Source 11/18/18 0612 Rectal     SpO2 11/18/18 0612 100 %     Weight 11/18/18 0555 41 kg (90 lb 6.2 oz)     Height 11/18/18 0555 1.575 m (5\' 2" )     Head Circumference --      Peak Flow --      Pain Score --      Pain Loc --      Pain Edu? --      Excl. in GC? --     Constitutional: Alert  Eyes: Conjunctivae are normal.  Head: Atraumatic. Nose: Nasal bridge swelling Mouth/Throat: Mucous membranes are moist. Neck: No stridor.  No meningeal signs.   Cardiovascular: Normal rate, regular rhythm. Good peripheral circulation. Grossly normal heart sounds. Respiratory: Normal respiratory  effort.  No retractions. Gastrointestinal: Soft and nontender. No distention.   Musculoskeletal: No lower extremity tenderness nor edema. No gross deformities of extremities. Neurologic:  Normal speech and language. No gross focal neurologic deficits are appreciated.  Skin:  Skin is warm, dry and intact. Psychiatric: Mood and affect are normal. Speech and behavior are normal.  ____________________________________________   LABS (all labs ordered are listed, but only abnormal results are displayed)  Labs Reviewed  CBC  COMPREHENSIVE METABOLIC PANEL  URINALYSIS, COMPLETE (UACMP) WITH MICROSCOPIC  TROPONIN I (HIGH SENSITIVITY)   ____________________________________________  EKG  ED ECG REPORT I, Jensen N BROWN, the attending physician, personally viewed and interpreted this ECG.   Date: 11/18/2018  EKG Time: 7:21 AM  Rate: 70  Rhythm: Normal sinus rhythm  Axis: Normal  Intervals: Normal  ST&T Change: None    Procedures   ____________________________________________   INITIAL IMPRESSION / MDM / ASSESSMENT AND PLAN / ED COURSE  As part of my medical decision making, I reviewed the following data within the electronic MEDICAL RECORD NUMBER   85 year old female presented with above-stated history and physical exam following unwitnessed fall.  Laboratory data CT head face and cervical spine pending at this time.  Patient's care transferred to Dr. Marisa SeverinSiadecki  ____________________________________________  FINAL CLINICAL IMPRESSION(S) / ED DIAGNOSES  Final diagnoses:  Closed fracture of nasal bone, initial encounter  Urinary tract infection without hematuria, site unspecified     MEDICATIONS GIVEN DURING THIS VISIT:  Medications - No data to display   ED Discharge Orders    None      *Please note:  Michelle Ballard was evaluated in Emergency Department on 11/18/2018 for the symptoms described in the history of present illness. She was evaluated in the context of the  global COVID-19 pandemic, which necessitated consideration that the patient might be at risk for infection with the SARS-CoV-2 virus that causes COVID-19. Institutional protocols and algorithms that pertain to the evaluation of patients at risk for COVID-19 are in a state of rapid change based on information released by regulatory bodies including the CDC and federal and state organizations. These policies and algorithms were followed during the patient's care in the ED.  Some ED evaluations and interventions may be delayed as a result of limited staffing during the pandemic.*  Note:  This document was prepared using Dragon voice recognition software and may include unintentional dictation errors.   Darci CurrentBrown, [place] N, MD 11/19/18 [phone]

## 2018-11-18 NOTE — ED Notes (Signed)
Pt to CT

## 2018-11-18 NOTE — ED Notes (Signed)
This RN updated patients son Paulla Mcclaskey. He would like to be updated again once test results are back.

## 2018-11-18 NOTE — Discharge Instructions (Addendum)
Michelle Ballard has a fracture of her nasal bones.  She can follow-up with an ENT in the next 1 to 2 weeks if needed.  The lab work-up also shows signs of a urinary tract infection.  An antibiotic has been provided.  She should return to the ER for new or worsening weakness, high fever, vomiting, recurrent fall, or any other new or worsening symptoms.

## 2019-04-12 DEATH — deceased
